# Patient Record
Sex: Female | Born: 1990
Health system: Southern US, Community
[De-identification: ages and names within clinical notes are randomized; demographics above are authoritative.]

## PROBLEM LIST (undated history)

## (undated) DIAGNOSIS — F32A Depression, unspecified: Secondary | ICD-10-CM

## (undated) DIAGNOSIS — F419 Anxiety disorder, unspecified: Secondary | ICD-10-CM

## (undated) DIAGNOSIS — T783XXA Angioneurotic edema, initial encounter: Secondary | ICD-10-CM

## (undated) DIAGNOSIS — F329 Major depressive disorder, single episode, unspecified: Secondary | ICD-10-CM

---
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
## 2008-12-30 ENCOUNTER — Ambulatory Visit (HOSPITAL_BASED_OUTPATIENT_CLINIC_OR_DEPARTMENT_OTHER): Admission: RE | Admit: 2008-12-30 | Discharge: 2008-12-30 | Payer: Self-pay | Admitting: Orthopedic Surgery

## 2009-02-09 ENCOUNTER — Ambulatory Visit: Payer: Self-pay | Admitting: Women's Health

## 2009-04-01 ENCOUNTER — Ambulatory Visit: Payer: Self-pay | Admitting: Women's Health

## 2009-04-18 ENCOUNTER — Ambulatory Visit: Payer: Self-pay | Admitting: Obstetrics and Gynecology

## 2009-04-20 ENCOUNTER — Ambulatory Visit: Payer: Self-pay | Admitting: Women's Health

## 2009-05-04 ENCOUNTER — Ambulatory Visit: Payer: Self-pay | Admitting: Obstetrics and Gynecology

## 2009-09-28 ENCOUNTER — Ambulatory Visit: Payer: Self-pay | Admitting: Women's Health

## 2009-11-29 ENCOUNTER — Ambulatory Visit: Payer: Self-pay | Admitting: Obstetrics and Gynecology

## 2009-11-29 ENCOUNTER — Other Ambulatory Visit: Admission: RE | Admit: 2009-11-29 | Discharge: 2009-11-29 | Payer: Self-pay | Admitting: Obstetrics and Gynecology

## 2010-04-05 ENCOUNTER — Ambulatory Visit: Payer: Self-pay | Admitting: Women's Health

## 2011-01-03 LAB — POCT HEMOGLOBIN-HEMACUE: Hemoglobin: 14.9 g/dL (ref 12.0–15.0)

## 2011-01-29 ENCOUNTER — Other Ambulatory Visit (HOSPITAL_COMMUNITY)
Admission: RE | Admit: 2011-01-29 | Discharge: 2011-01-29 | Disposition: A | Discharge status: Home / Self Care | Payer: BC Managed Care – PPO | Source: Ambulatory Visit | Attending: Obstetrics and Gynecology | Admitting: Obstetrics and Gynecology

## 2011-01-29 ENCOUNTER — Encounter (INDEPENDENT_AMBULATORY_CARE_PROVIDER_SITE_OTHER): Payer: BC Managed Care – PPO | Admitting: Women's Health

## 2011-01-29 ENCOUNTER — Other Ambulatory Visit: Payer: Self-pay | Admitting: Women's Health

## 2011-01-29 DIAGNOSIS — Z124 Encounter for screening for malignant neoplasm of cervix: Secondary | ICD-10-CM | POA: Insufficient documentation

## 2011-01-29 DIAGNOSIS — R823 Hemoglobinuria: Secondary | ICD-10-CM

## 2011-01-29 DIAGNOSIS — E079 Disorder of thyroid, unspecified: Secondary | ICD-10-CM

## 2011-01-29 DIAGNOSIS — Z01419 Encounter for gynecological examination (general) (routine) without abnormal findings: Secondary | ICD-10-CM

## 2011-02-06 NOTE — Op Note (Signed)
Ariel Richardson, Ariel Richardson                 ACCOUNT NO.:  [id number]   MEDICAL RECORD NO.:  [id number]          PATIENT TYPE:  AMB   LOCATION:  DSC                          FACILITY:  [facility]   PHYSICIAN:  Katy Fitch. Sypher, M.D. DATE OF BIRTH:  Apr 22, 1991   DATE OF PROCEDURE:  DATE OF DISCHARGE:                               OPERATIVE REPORT   PREOPERATIVE DIAGNOSIS:  Chronic left index radial proper digital nerve  laceration sustained at level of proximal finger flexion crease, left  index finger, October 30, 2008.   POSTOPERATIVE DIAGNOSIS:  Complete transection of radial proper digital  artery and nerve at level of index finger proximal finger flexion  crease.  No evidence of injury to flexor sheath.  No injury to ulnar  proper digital nerve and artery.   OPERATION:  Exploration of left index finger radial proper digital  artery and nerve with subsequent resection of neuroma and fully glioma  from stumps of radial proper digital nerve followed by delayed primary  repair with microsurgical technique and 9-0 nylon.   OPERATIONS:  Katy Fitch. Sypher, MD   ASSISTANT:  Ariel Reeks Dasnoit, PA   ANESTHESIA:  Left infraclavicular block.   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   Ariel Richardson was visiting [facility] and sustained a laceration of her  hand while preparing for [employer].  She was evaluated at [facility] and advised to undergo exploration of her hand and repair  of her nerve versus nerve tube reconstruction.   Due to a background history of idiopathic angioedema, she had her family  were concerned about proceeding with anesthesia in [place]  and  elected to return home to [place],  [place].   She is under the care of Dr. Homero Richardson, [facility], for  her angioedema predicament that was noted after a wisdom tooth  extraction procedure under general anesthesia.   A preoperative anesthesia consult was obtained with Dr. Sampson Richardson and  arrangements were made to perform her surgery under a regional block.   After detailed informed consent in which we advised Ariel Richardson that we would  approach for nerve injury with one of three solutions:  A.  Delayed primary repair.  B.  Use of an autogenous posterior interosseous nerve graft.  C.  Utilization of a collagen nerve conduit.   Given the location of the injury, there was an excellent chance that we  will be able to perform a delayed primary repair without tension due to  our ability to flex the metacarpal phalangeal joint and mobilize the  proper digital nerve proximal distal stumps.   After informed consent, she is brought to the operating room at this  time.   PROCEDURE:  Ariel Richardson was brought to the operating room and placed  in  supine position upon operating table.   Following an anesthesia consult with Dr. Sampson Richardson, a left  infraclavicular block was placed without complication.  There was no  evidence of any angioedema issues.  Ariel Richardson had been on danazol 5 days  preoperatively.   When anesthesia was satisfactory, she was transferred to room 8 of the  [facility], placed in supine position upon the operating table  and IV sedation provided under Dr. Jarrett Ables direct supervision.   The left arm was prepped with Betadine soap solution and sterilely  draped.  A pneumatic tourniquet was applied to the proximal left  brachium.  Upon exsanguination of the left arm with Esmarch bandage,  arterial tourniquet was inflated to 220 mmHg.  The procedure commenced  with creation of a radially-based flap extending from the distal palmar  crease to  the PIP flexion crease.  Subcutaneous tissues were carefully  divided taking care to identify the flexor sheath and scar at the site  of the laceration.  The proximal and distal segments of the radial  proper digital nerve were dissected into the area of scar, and a  complete transection of the proper digital artery and nerve identified.  The neuroma and glioma stumps were mobilized and were found to have  retraction of less than 2 mm.  With neurolysis, we were able to easily  overlap the neuroma and ganglioma stumps with a finger in full  extension.   With the aid of the operating microscope, the neuroma and glioma stumps  were transected with removal of approximately 2 mm of the glioma stump  and 3 mm of the neuroma stump.   With flexion of the MP joint of only 30 degrees, we were able to overlap  the nerve without tension.   The nerve was then repaired with the aid of a operating microscope  utilizing 9-0 nylon simple epineural sutures of back wall first  technique.  The epineurium was well approximated encapsulating all of  the neural elements.   Mehr's finger was maintained in a position of flexion at the MP joint  beyond 40 degrees throughout the repair.   The wound was then inspected for bleeding points and repaired with  interrupted suture of 5-0 nylon.   A compressive dressing was applied with Xeroflo sterile gauze, sterile  Webril, and a dorsal plaster splint maintaining the wrist in 30 degrees  of palmar flexion and the index finger in 40 degrees flexion at the MP  joint.  This should allow a tension-free repair.   Our goal will be to maintain a flexed posture of the index finger MP  joint for at least 3 weeks postoperatively.  Thereafter, we will  serially extension splint Vida's finger and allow recovery of nerve  glide.   There were no apparent complications.   Ariel Richardson tolerated surgery and anesthesia well.  She was transferred to  recovery with stable  signs.  She will be discharged home with  prescriptions for Vicodin 5 mg 1 p.o. 4-6 hours p.r.n. pain 24 tablets  without refill.  Also, doxycycline 100 mg p.o. q.12 hours x4 days as a  prophylactic antibiotic.      Katy Fitch Sypher, M.D.  Electronically Signed     RVS/MEDQ  D:  12/30/2008  T:  12/30/2008  Job:  [id number]

## 2011-04-06 ENCOUNTER — Institutional Professional Consult (permissible substitution) (INDEPENDENT_AMBULATORY_CARE_PROVIDER_SITE_OTHER): Payer: BC Managed Care – PPO | Admitting: Women's Health

## 2011-04-06 DIAGNOSIS — F329 Major depressive disorder, single episode, unspecified: Secondary | ICD-10-CM

## 2011-10-13 ENCOUNTER — Ambulatory Visit (INDEPENDENT_AMBULATORY_CARE_PROVIDER_SITE_OTHER): Payer: BC Managed Care – PPO

## 2011-10-13 DIAGNOSIS — R05 Cough: Secondary | ICD-10-CM

## 2011-10-13 DIAGNOSIS — R509 Fever, unspecified: Secondary | ICD-10-CM

## 2011-10-13 DIAGNOSIS — IMO0001 Reserved for inherently not codable concepts without codable children: Secondary | ICD-10-CM

## 2012-03-04 DIAGNOSIS — D841 Defects in the complement system: Secondary | ICD-10-CM | POA: Insufficient documentation

## 2012-03-31 DIAGNOSIS — Z975 Presence of (intrauterine) contraceptive device: Secondary | ICD-10-CM | POA: Insufficient documentation

## 2012-07-21 ENCOUNTER — Ambulatory Visit (INDEPENDENT_AMBULATORY_CARE_PROVIDER_SITE_OTHER): Payer: PRIVATE HEALTH INSURANCE | Admitting: Family Medicine

## 2012-07-21 ENCOUNTER — Encounter: Payer: Self-pay | Admitting: Emergency Medicine

## 2012-07-21 VITALS — BP 102/62 | HR 87 | Temp 98.1°F | Resp 16 | Ht 64.0 in | Wt 113.4 lb

## 2012-07-21 DIAGNOSIS — J029 Acute pharyngitis, unspecified: Secondary | ICD-10-CM

## 2012-07-21 DIAGNOSIS — R05 Cough: Secondary | ICD-10-CM

## 2012-07-21 DIAGNOSIS — J40 Bronchitis, not specified as acute or chronic: Secondary | ICD-10-CM

## 2012-07-21 DIAGNOSIS — D841 Defects in the complement system: Secondary | ICD-10-CM | POA: Insufficient documentation

## 2012-07-21 LAB — POCT RAPID STREP A (OFFICE): Rapid Strep A Screen: NEGATIVE

## 2012-07-21 MED ORDER — AZITHROMYCIN 250 MG PO TABS: ORAL_TABLET | ORAL | Status: DC

## 2012-07-21 NOTE — Progress Notes (Signed)
[facility] [address] [place] [address] [phone]  Date:  07/21/2012   Name:  Ariel Richardson   DOB:  1991-07-28   MRN:  [id number]  PCP:  No primary provider on file.    Chief Complaint: Cough, Sore Throat, Headache and Shortness of Breath   History of Present Illness:  Ariel Richardson is a 21 y.o. very pleasant female patient who presents with the following:  She is here today with a ST, fatigue. She has felt "run down" for a couple of weeks- not enough to have to miss work.  Then her ST started 2 days ago and she has been feeling worse.   She also has a cough- non productive.  She does feel congested.  She has been coughing for a few weeks- the also has gotten worse over the last few days She does not think that she has had a fever.  She has noted body aches and chills.  No GI symptom Her BF was sick last week.   She has implanon- she does not get menses.  Her implanon was replaced over the summer.   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  There is no problem list on file for this patient.   No past medical history on file.  No past surgical history on file.  History  Substance Use Topics  . Smoking status: Never Smoker   . Smokeless tobacco: Not on file  . Alcohol Use: Not on file    No family history on file.  No Known Allergies  Medication list has been reviewed and updated.  Current Outpatient Prescriptions on File Prior to Visit  Medication Sig Dispense Refill  . amphetamine-dextroamphetamine (ADDERALL XR) 10 MG 24 hr capsule Take 10 mg by mouth. Takes 1-2 per day during the week. Takes only prn on weekends.      . etonogestrel (IMPLANON) 68 MG IMPL implant Inject 1 each into the skin once.        Review of Systems:  As per HPI- otherwise negative.   Physical Examination: Filed Vitals:   07/21/12 0833  BP: 102/62  Pulse: 87  Temp: 98.1  F (36.7 C)  Resp: 16   Filed Vitals:   07/21/12 0833  Height: 5\' 4"  (1.626 m)  Weight: 113 lb 6.4 oz (51.438 kg)   Body mass index is 19.47 kg/(m^2). Ideal Body Weight: Weight in (lb) to have BMI = 25: 145.3   GEN: WDWN, NAD, Non-toxic, A & O x 3- coughing in room   HEENT: Atraumatic, Normocephalic. Neck supple. No masses, No LAD.  Bilateral TM wnl, oropharynx normal.  PEERL,EOMI.   Ears and Nose: No external deformity. CV: RRR, No M/G/R. No JVD. No thrill. No extra heart sounds. PULM: CTA B, no wheezes, crackles, rhonchi. No retractions. No resp. distress. No accessory muscle use. ABD: S, NT, ND, +BS. No rebound. No HSM. EXTR: No c/c/e NEURO Normal gait.  PSYCH: Normally interactive. Conversant. Not depressed or anxious appearing.  Calm demeanor.   Results for orders placed in visit on 07/21/12  POCT RAPID STREP A (OFFICE)      Component Value Range   Rapid Strep A Screen Negative  Negative   Assessment and Plan: 1. Sore throat  POCT rapid strep A  2. Cough  azithromycin (ZITHROMAX) 250 MG tablet  3. Bronchitis     Ariel Richardson has been ill for a  couple of weeks- her cough is now worse and she is feeling worse overall.  Will treat for bronchitis with azithromycin.  Instructed to let me know if not feeling better in the next few days- Sooner if worse.   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

## 2012-07-28 ENCOUNTER — Telehealth: Payer: Self-pay

## 2012-07-28 NOTE — Telephone Encounter (Signed)
The patient called to request that a clinical team member return her call.  The patient stated she was treated last Monday (07/21/12) with a Z pak and she is still not better.  Please call the patient at [phone].

## 2012-07-28 NOTE — Telephone Encounter (Signed)
States she has sore throat, she is advised the Z pack will continue to work for several days, she is to take Aleve or Tylenol for the sore throat, if not better in next 1-2 days she needs recheck.

## 2013-08-11 ENCOUNTER — Telehealth: Payer: Self-pay | Admitting: *Deleted

## 2013-08-11 NOTE — Telephone Encounter (Signed)
Pt called requested medical advice about some bleeding and has not been seen since 2012. I explained to pt that she would need a OV since last OV in 2012. Pt said she will find a MD locally in [place] area.

## 2015-01-20 ENCOUNTER — Other Ambulatory Visit (HOSPITAL_COMMUNITY): Payer: Self-pay | Admitting: Internal Medicine

## 2015-01-20 DIAGNOSIS — R110 Nausea: Secondary | ICD-10-CM

## 2015-01-20 DIAGNOSIS — R51 Headache: Principal | ICD-10-CM

## 2015-01-20 DIAGNOSIS — R519 Headache, unspecified: Secondary | ICD-10-CM

## 2015-01-20 DIAGNOSIS — R42 Dizziness and giddiness: Secondary | ICD-10-CM

## 2015-01-21 ENCOUNTER — Other Ambulatory Visit: Payer: Self-pay | Admitting: Internal Medicine

## 2015-01-21 DIAGNOSIS — R42 Dizziness and giddiness: Secondary | ICD-10-CM

## 2017-03-30 ENCOUNTER — Emergency Department (HOSPITAL_BASED_OUTPATIENT_CLINIC_OR_DEPARTMENT_OTHER): Payer: BC Managed Care – PPO

## 2017-03-30 ENCOUNTER — Emergency Department (HOSPITAL_BASED_OUTPATIENT_CLINIC_OR_DEPARTMENT_OTHER)
Admission: EM | Admit: 2017-03-30 | Discharge: 2017-03-30 | Disposition: A | Discharge status: Home / Self Care | Payer: BC Managed Care – PPO | Attending: Emergency Medicine | Admitting: Emergency Medicine

## 2017-03-30 ENCOUNTER — Encounter (HOSPITAL_BASED_OUTPATIENT_CLINIC_OR_DEPARTMENT_OTHER): Payer: Self-pay | Admitting: Emergency Medicine

## 2017-03-30 DIAGNOSIS — N200 Calculus of kidney: Secondary | ICD-10-CM | POA: Insufficient documentation

## 2017-03-30 DIAGNOSIS — R1031 Right lower quadrant pain: Secondary | ICD-10-CM | POA: Diagnosis present

## 2017-03-30 DIAGNOSIS — Z79899 Other long term (current) drug therapy: Secondary | ICD-10-CM | POA: Insufficient documentation

## 2017-03-30 HISTORY — DX: Angioneurotic edema, initial encounter: T78.3XXA

## 2017-03-30 HISTORY — DX: Anxiety disorder, unspecified: F41.9

## 2017-03-30 HISTORY — DX: Depression, unspecified: F32.A

## 2017-03-30 HISTORY — DX: Major depressive disorder, single episode, unspecified: F32.9

## 2017-03-30 LAB — COMPREHENSIVE METABOLIC PANEL
ALBUMIN: 4.2 g/dL (ref 3.5–5.0)
ALK PHOS: 113 U/L (ref 38–126)
ALT: 15 U/L (ref 14–54)
AST: 25 U/L (ref 15–41)
Anion gap: 12 (ref 5–15)
BILIRUBIN TOTAL: 0.4 mg/dL (ref 0.3–1.2)
BUN: 22 mg/dL — ABNORMAL HIGH (ref 6–20)
CALCIUM: 8.9 mg/dL (ref 8.9–10.3)
CO2: 24 mmol/L (ref 22–32)
Chloride: 103 mmol/L (ref 101–111)
Creatinine, Ser: 0.79 mg/dL (ref 0.44–1.00)
GFR calc Af Amer: >60 mL/min (ref >60)
GFR calc non Af Amer: >60 mL/min (ref >60)
GLUCOSE: 119 mg/dL — AB (ref 65–99)
Potassium: 3.1 mmol/L — ABNORMAL LOW (ref 3.5–5.1)
Sodium: 139 mmol/L (ref 135–145)
TOTAL PROTEIN: 7.4 g/dL (ref 6.5–8.1)

## 2017-03-30 LAB — URINALYSIS, ROUTINE W REFLEX MICROSCOPIC
BILIRUBIN URINE: NEGATIVE
Glucose, UA: NEGATIVE mg/dL
Hgb urine dipstick: NEGATIVE
Ketones, ur: NEGATIVE mg/dL
LEUKOCYTES UA: NEGATIVE
Nitrite: NEGATIVE
PROTEIN: NEGATIVE mg/dL
Specific Gravity, Urine: 1.023 (ref 1.005–1.030)
pH: 6 (ref 5.0–8.0)

## 2017-03-30 LAB — CBC WITH DIFFERENTIAL/PLATELET
BASOS ABS: 0 10*3/uL (ref 0.0–0.1)
BASOS PCT: 0 %
Eosinophils Absolute: 0.1 10*3/uL (ref 0.0–0.7)
Eosinophils Relative: 2 %
HEMATOCRIT: 37.2 % (ref 36.0–46.0)
HEMOGLOBIN: 12.8 g/dL (ref 12.0–15.0)
Lymphocytes Relative: 40 %
Lymphs Abs: 2.9 10*3/uL (ref 0.7–4.0)
MCH: 29.4 pg (ref 26.0–34.0)
MCHC: 34.4 g/dL (ref 30.0–36.0)
MCV: 85.5 fL (ref 78.0–100.0)
Monocytes Absolute: 0.5 10*3/uL (ref 0.1–1.0)
Monocytes Relative: 7 %
NEUTROS ABS: 3.7 10*3/uL (ref 1.7–7.7)
NEUTROS PCT: 51 %
Platelets: 211 10*3/uL (ref 150–400)
RBC: 4.35 MIL/uL (ref 3.87–5.11)
RDW: 13.2 % (ref 11.5–15.5)
WBC: 7.3 10*3/uL (ref 4.0–10.5)

## 2017-03-30 LAB — PREGNANCY, URINE: PREG TEST UR: NEGATIVE

## 2017-03-30 LAB — LIPASE, BLOOD: Lipase: 31 U/L (ref 11–51)

## 2017-03-30 MED ORDER — ONDANSETRON HCL 4 MG/2ML IJ SOLN
4.0000 mg | Freq: Once | INTRAMUSCULAR | Status: AC
  Administered 2017-03-30: 4 mg via INTRAVENOUS
  Filled 2017-03-30: qty 2

## 2017-03-30 MED ORDER — IOPAMIDOL (ISOVUE-300) INJECTION 61%
100.0000 mL | Freq: Once | INTRAVENOUS | Status: AC | PRN
  Administered 2017-03-30: 100 mL via INTRAVENOUS

## 2017-03-30 MED ORDER — SODIUM CHLORIDE 0.9 % IV SOLN
Freq: Once | INTRAVENOUS | Status: AC
  Administered 2017-03-30: 13:00:00 via INTRAVENOUS

## 2017-03-30 MED ORDER — MORPHINE SULFATE (PF) 4 MG/ML IV SOLN
4.0000 mg | Freq: Once | INTRAVENOUS | Status: AC
  Administered 2017-03-30: 4 mg via INTRAVENOUS
  Filled 2017-03-30: qty 1

## 2017-03-30 MED ORDER — POTASSIUM CHLORIDE CRYS ER 20 MEQ PO TBCR
40.0000 meq | EXTENDED_RELEASE_TABLET | Freq: Once | ORAL | Status: AC
  Administered 2017-03-30: 40 meq via ORAL
  Filled 2017-03-30: qty 2

## 2017-03-30 MED ORDER — HYDROCODONE-ACETAMINOPHEN 5-325 MG PO TABS: 1.0000 | ORAL_TABLET | Freq: Four times a day (QID) | ORAL | 0 refills | Status: DC | PRN

## 2017-03-30 NOTE — ED Triage Notes (Signed)
RLQ pain radiating to back x 2 hours, denies NV.

## 2017-03-30 NOTE — ED Notes (Signed)
Pt in CT.

## 2017-03-30 NOTE — ED Notes (Signed)
Pt ambulatory with husband at side

## 2017-03-30 NOTE — Discharge Instructions (Signed)
It was my pleasure taking care of you today!   You have been diagnosed with kidney stones. Drink plenty of fluids to help you pass the stone.  Take Tylenol as directed for mild to moderate pain. Use your pain medication as directed and only as needed for severe pain. You will not be able to breast feed if you are on this medication.  Follow up with the urology clinic listed in regards to your hospital visit.   Return to the ED immediately if you develop fever that persists > 101, uncontrolled pain or vomiting, or other concerns.   Do not drink alcohol, drive or participate in any other potentially dangerous activities while taking opiate pain medication as it may make you sleepy. Do not take this medication with any other sedating medications, either prescription or over-the-counter. If you were prescribed Percocet or Vicodin, do not take these with acetaminophen (Tylenol) as it is already contained within these medications.   This medication is an opiate (or narcotic) pain medication and can be habit forming.  Use it as little as possible to achieve adequate pain control.  Do not use or use it with extreme caution if you have a history of opiate abuse or dependence. This medication is intended for your use only - do not give any to anyone else and keep it in a secure place where nobody else, especially children, have access to it. It will also cause or worsen constipation, so you may want to consider taking an over-the-counter stool softener while you are taking this medication.

## 2017-03-30 NOTE — ED Notes (Signed)
Pt teaching provided on medications that may cause drowsiness. Pt instructed not to drive or operate heavy machinery while taking the prescribed medication. Pt verbalized understanding.   

## 2017-03-30 NOTE — ED Provider Notes (Signed)
[facility] Note   CSN: [id number] Arrival date & time: 03/30/17  1145     History   Chief Complaint Chief Complaint  Patient presents with  . Abdominal Pain    HPI Ariel Richardson is a 26 y.o. female.  The history is provided by the patient and medical records. No language interpreter was used.   Ariel Richardson is a 26 y.o. female  with a PMH of hereditary angioedema, anxiety and depression who presents to the Emergency Department complaining of acute onset of cramping waxing-waning right lower quadrant pain which began this morning and has been progressively worsening. Radiates to lower back. She took a dose of her Berinert today which she takes for angioedema flare-ups, thinking this could be 2/2 angioedema flare. This did not help. She also took ibuprofen and home rx ativan. No aggravating factors noted. No dysuria but does have urinary frequency. No vaginal discharge or urinary urgency. No fevers. No hx of kidney stones. No history of similar pain.   Past Medical History:  Diagnosis Date  . Angio-edema   . Anxiety   . Depression     There are no active problems to display for this patient.   History reviewed. No pertinent surgical history.  OB History    No data available       Home Medications    Prior to Admission medications   Medication Sig Start Date End Date Taking? Authorizing Provider  amphetamine-dextroamphetamine (ADDERALL) 10 MG tablet Take 10 mg by mouth daily with breakfast.   Yes [provider]  escitalopram (LEXAPRO) 20 MG tablet Take 20 mg by mouth daily.   Yes [provider]  HYDROcodone-acetaminophen (NORCO/VICODIN) 5-325 MG tablet Take 1 tablet by mouth every 6 (six) hours as needed for severe pain. 03/30/17   Deanna Boehlke, Chase Picket, PA-C    Family History No family history on file.  Social History Social History  Substance Use Topics  . Smoking status: Never Smoker  . Smokeless tobacco: Never Used  .  Alcohol use Yes     Allergies   Patient has no known allergies.   Review of Systems Review of Systems  Constitutional: Negative for chills and fever.  Genitourinary: Positive for flank pain and frequency. Negative for difficulty urinating, dysuria, urgency, vaginal discharge and vaginal pain.  Musculoskeletal: Positive for back pain.  All other systems reviewed and are negative.    Physical Exam Updated Vital Signs BP (!) 107/58 (BP Location: Left Arm)   Pulse 72   Temp 97.6 F (36.4 C) (Oral)   Resp 18   Wt 59 kg (130 lb)   LMP 03/14/2017   SpO2 100%   Physical Exam  Constitutional: She is oriented to person, place, and time. She appears well-developed and well-nourished. No distress.  HENT:  Head: Normocephalic and atraumatic.  Cardiovascular: Normal rate, regular rhythm and normal heart sounds.   No murmur heard. Pulmonary/Chest: Effort normal and breath sounds normal. No respiratory distress. She has no wheezes. She has no rales.  Abdominal: Soft. Bowel sounds are normal. She exhibits no distension. There is tenderness.    Tenderness to palpation as depicted in image with no rebound or guarding. No focal tenderness at McBurney's point. No CVA tenderness.  Musculoskeletal: Normal range of motion.  Neurological: She is alert and oriented to person, place, and time.  Skin: Skin is warm and dry.  Nursing note and vitals reviewed.    ED Treatments / Results  Labs (all labs ordered are  listed, but only abnormal results are displayed) Labs Reviewed  COMPREHENSIVE METABOLIC PANEL - Abnormal; Notable for the following:       Result Value   Potassium 3.1 (*)    Glucose, Bld 119 (*)    BUN 22 (*)    All other components within normal limits  URINALYSIS, ROUTINE W REFLEX MICROSCOPIC  PREGNANCY, URINE  CBC WITH DIFFERENTIAL/PLATELET  LIPASE, BLOOD    EKG  EKG Interpretation None       Radiology Ct Abdomen Pelvis W Contrast  Result Date:  03/30/2017 CLINICAL DATA:  26 year old female with a history of right lower quadrant pain EXAM: CT ABDOMEN AND PELVIS WITH CONTRAST TECHNIQUE: Multidetector CT imaging of the abdomen and pelvis was performed using the standard protocol following bolus administration of intravenous contrast. CONTRAST:  ISOVUE-300 IOPAMIDOL (ISOVUE-300) INJECTION 61% COMPARISON:  None. FINDINGS: Lower chest: No acute abnormality. Hepatobiliary: No focal liver abnormality is seen. No gallstones, gallbladder wall thickening, or biliary dilatation. Pancreas: Unremarkable. No pancreatic ductal dilatation or surrounding inflammatory changes. Spleen: Normal in size without focal abnormality. Adrenals/Urinary Tract: Unremarkable adrenal glands. Right kidney without emphysema with perfusion similar to the left. Mild pelvicaliectasis and dilation of the right ureter. Right ureter is dilated throughout its length with enhancement of the urothelium. There is a an oblong stone at the ureterovesical junction measuring 6 mm - 7 mm in length and 2 mm -3 mm width. Unremarkable appearance of left kidney without hydronephrosis or nephrolithiasis. Unremarkable appearance of the urinary bladder. Stomach/Bowel: Unremarkable appearance of stomach, small bowel. Normal appendix. Mild a moderate formed stool burden. No transition point. Vascular/Lymphatic: Unremarkable appearance of the vasculature. No adenopathy. Reproductive: Unremarkable at adnexa. Other: No abdominal wall hernia or abnormality. No abdominopelvic ascites. Musculoskeletal: No acute bony abnormality. IMPRESSION: Obstructing right ureterovesical stone measuring 6 mm - 7 mm in greatest diameter. There is resulting mild hydronephrosis and dilation of the right ureter. Enhancement of the urothelial surface may reflect inflammation or infection. If there is concern for ascending urinary tract infection, recommend correlation with urinalysis. Electronically Signed   By: Gilmer Mor D.O.    On: 03/30/2017 14:17    Procedures Procedures (including critical care time)  Medications Ordered in ED Medications  morphine 4 MG/ML injection 4 mg (4 mg Intravenous Given 03/30/17 1245)  ondansetron (ZOFRAN) injection 4 mg (4 mg Intravenous Given 03/30/17 1245)  0.9 %  sodium chloride infusion ( Intravenous Stopped 03/30/17 1439)  iopamidol (ISOVUE-300) 61 % injection 100 mL (100 mLs Intravenous Contrast Given 03/30/17 1349)  potassium chloride SA (K-DUR,KLOR-CON) CR tablet 40 mEq (40 mEq Oral Given 03/30/17 1442)     Initial Impression / Assessment and Plan / ED Course  I have reviewed the triage vital signs and the nursing notes.  Pertinent labs & imaging results that were available during my care of the patient were reviewed by me and considered in my medical decision making (see chart for details).    Ariel Richardson is a 26 y.o. female who presents to ED for right lower abdominal pain. Patient is nontoxic, nonseptic appearing with tenderness to right lower quadrant and flank. No focal area of tenderness at McBurney's. No rebound tenderness. Patient does have a history of hereditary angioedema and has had GI flares in the past. She states this is not similar. She has no history of kidney stones. Given right lower quadrant pain and history of angioedema, will obtain CT abdomen pelvis with contrast instead of CT renal study. Patient given fluids, Zofran  and morphine. On reevaluation, pain is controlled. She no longer feels nauseous and has had no episodes of emesis. Labs reviewed and reassuring. She did have hypokalemia at 3.1 which was replenished in ED. UA shows no signs of infection or blood. CT renal study shows a 6-7 mm right uterovesical or stone with mild hydro.  Patient's pain and other symptoms have been adequately managed in the Emergency Department today. I feel she is appropriate for outpatient therapy with referral to nephrology if symptoms do not persist. She is breastfeeding. Discussed  tylenol PRN mild-to-moderate pain. If this is not enough pain control, she will have norco to use however she will need to pump and dump. We spoke at length about this with mother and significant other in the room. She expressed understanding of not breast feeding on this medication. Return precautions discussed and all questions answered.   Final Clinical Impressions(s) / ED Diagnoses   Final diagnoses:  Nephrolithiasis    New Prescriptions Discharge Medication List as of 03/30/2017  2:59 PM    START taking these medications   Details  HYDROcodone-acetaminophen (NORCO/VICODIN) 5-325 MG tablet Take 1 tablet by mouth every 6 (six) hours as needed for severe pain., Starting Sat 03/30/2017, Print         Kaylor Maiers, Chase Picket, PA-C 03/30/17 1532    Melene Plan, DO 03/30/17 1550

## 2017-03-31 ENCOUNTER — Encounter: Payer: Self-pay | Admitting: Emergency Medicine

## 2019-06-18 DIAGNOSIS — F32A Depression, unspecified: Secondary | ICD-10-CM | POA: Insufficient documentation

## 2020-07-19 DIAGNOSIS — D841 Defects in the complement system: Secondary | ICD-10-CM | POA: Diagnosis not present

## 2020-12-30 DIAGNOSIS — F4321 Adjustment disorder with depressed mood: Secondary | ICD-10-CM | POA: Diagnosis not present

## 2021-01-11 DIAGNOSIS — F419 Anxiety disorder, unspecified: Secondary | ICD-10-CM | POA: Diagnosis not present

## 2021-01-12 DIAGNOSIS — F4321 Adjustment disorder with depressed mood: Secondary | ICD-10-CM | POA: Diagnosis not present

## 2021-01-18 DIAGNOSIS — F411 Generalized anxiety disorder: Secondary | ICD-10-CM | POA: Diagnosis not present

## 2021-01-18 DIAGNOSIS — F419 Anxiety disorder, unspecified: Secondary | ICD-10-CM | POA: Diagnosis not present

## 2021-01-18 DIAGNOSIS — F900 Attention-deficit hyperactivity disorder, predominantly inattentive type: Secondary | ICD-10-CM | POA: Diagnosis not present

## 2021-01-20 DIAGNOSIS — F4321 Adjustment disorder with depressed mood: Secondary | ICD-10-CM | POA: Diagnosis not present

## 2021-01-25 DIAGNOSIS — F419 Anxiety disorder, unspecified: Secondary | ICD-10-CM | POA: Diagnosis not present

## 2021-01-27 DIAGNOSIS — F4321 Adjustment disorder with depressed mood: Secondary | ICD-10-CM | POA: Diagnosis not present

## 2021-02-02 DIAGNOSIS — F32A Depression, unspecified: Secondary | ICD-10-CM | POA: Diagnosis not present

## 2021-02-02 DIAGNOSIS — F411 Generalized anxiety disorder: Secondary | ICD-10-CM | POA: Diagnosis not present

## 2021-02-10 DIAGNOSIS — F4321 Adjustment disorder with depressed mood: Secondary | ICD-10-CM | POA: Diagnosis not present

## 2021-02-15 DIAGNOSIS — F419 Anxiety disorder, unspecified: Secondary | ICD-10-CM | POA: Diagnosis not present

## 2021-03-07 DIAGNOSIS — F419 Anxiety disorder, unspecified: Secondary | ICD-10-CM | POA: Diagnosis not present

## 2021-03-10 DIAGNOSIS — U071 COVID-19: Secondary | ICD-10-CM | POA: Diagnosis not present

## 2021-03-30 DIAGNOSIS — F411 Generalized anxiety disorder: Secondary | ICD-10-CM | POA: Diagnosis not present

## 2021-03-30 DIAGNOSIS — F900 Attention-deficit hyperactivity disorder, predominantly inattentive type: Secondary | ICD-10-CM | POA: Diagnosis not present

## 2021-03-31 DIAGNOSIS — F419 Anxiety disorder, unspecified: Secondary | ICD-10-CM | POA: Diagnosis not present

## 2021-04-05 DIAGNOSIS — F419 Anxiety disorder, unspecified: Secondary | ICD-10-CM | POA: Diagnosis not present

## 2021-04-14 DIAGNOSIS — F419 Anxiety disorder, unspecified: Secondary | ICD-10-CM | POA: Diagnosis not present

## 2021-04-21 DIAGNOSIS — F419 Anxiety disorder, unspecified: Secondary | ICD-10-CM | POA: Diagnosis not present

## 2021-05-15 ENCOUNTER — Other Ambulatory Visit (HOSPITAL_COMMUNITY)
Admission: RE | Admit: 2021-05-15 | Discharge: 2021-05-15 | Disposition: A | Discharge status: Home / Self Care | Payer: BC Managed Care – PPO | Source: Ambulatory Visit | Attending: Obstetrics & Gynecology | Admitting: Obstetrics & Gynecology

## 2021-05-15 ENCOUNTER — Ambulatory Visit (HOSPITAL_BASED_OUTPATIENT_CLINIC_OR_DEPARTMENT_OTHER): Payer: BC Managed Care – PPO | Admitting: Obstetrics & Gynecology

## 2021-05-15 ENCOUNTER — Encounter (HOSPITAL_BASED_OUTPATIENT_CLINIC_OR_DEPARTMENT_OTHER): Payer: Self-pay | Admitting: Obstetrics & Gynecology

## 2021-05-15 ENCOUNTER — Other Ambulatory Visit: Payer: Self-pay

## 2021-05-15 VITALS — BP 119/76 | HR 83 | Ht 63.0 in | Wt 145.0 lb

## 2021-05-15 DIAGNOSIS — Z975 Presence of (intrauterine) contraceptive device: Secondary | ICD-10-CM

## 2021-05-15 DIAGNOSIS — Z124 Encounter for screening for malignant neoplasm of cervix: Secondary | ICD-10-CM

## 2021-05-15 DIAGNOSIS — D841 Defects in the complement system: Secondary | ICD-10-CM

## 2021-05-15 DIAGNOSIS — R5383 Other fatigue: Secondary | ICD-10-CM | POA: Diagnosis not present

## 2021-05-15 DIAGNOSIS — Z01419 Encounter for gynecological examination (general) (routine) without abnormal findings: Secondary | ICD-10-CM | POA: Diagnosis not present

## 2021-05-15 NOTE — Progress Notes (Signed)
30 y.o. [id number] Married White or Caucasian female here for annual exam/new patient exam.  She has moved around a lot the past several years.  Her husband is a Nurse, mental health and has work with [employer].  Now with and on line company.    Cycles are "fairly" regular.  Has a Nexplanon and this was placed post partum.  They are undecided about adding to their family.  She reports significant perinatal anxiety.  She was treated with Lexapro and bupropion.  She has stopped the bupropion.  Anxiety is much better now.  She see Dr. Keturah BarreGerrit Friends, psychiatrist, in [place].     Patient's last menstrual period was 05/08/2021 (exact date).          Sexually active: Yes.    The current method of family planning is Nexplanon.    Exercising: not formal Smoker:  no  Health Maintenance: Pap:  pt thinks 2020, cannot find in care everywhere History of abnormal Pap:  no MMG:  screening guidelines discussed Colonoscopy:  screening guidelines reviewed TDaP:  unsure Screening Labs: will draw today   reports that she has never smoked. She has never used smokeless tobacco. She reports current alcohol use. She reports that she does not use drugs.  Past Medical History:  Diagnosis Date   Angio-edema    Anxiety    Depression     History reviewed. No pertinent surgical history.  Current Outpatient Medications  Medication Sig Dispense Refill   amphetamine-dextroamphetamine (ADDERALL XR) 10 MG 24 hr capsule Take 10 mg by mouth. Takes 1-2 per day during the week. Takes only prn on weekends.     C1 esterase inhibitor, Human, (BERINERT) 500 UNITS KIT Inject 20 Units/kg into the vein.     escitalopram (LEXAPRO) 20 MG tablet Take 30 mg by mouth daily.     etonogestrel (NEXPLANON) 68 MG IMPL implant Inject 1 each into the skin once.     LORazepam (ATIVAN) 0.5 MG tablet Take 0.5 mg by mouth 2 (two) times daily as needed.     Multiple Vitamin (MULTIVITAMIN) tablet Take 1 tablet by mouth daily.     TAKHZYRO  300 MG/2ML SOLN Inject into the skin.     No current facility-administered medications for this visit.    Family History  Problem Relation Age of Onset   Dementia Paternal Grandfather    Breast cancer Paternal Grandmother    Angioedema Mother    Depression Mother    Anxiety disorder Mother    Angioedema Brother    Angioedema Sister    Angioedema Son     Review of Systems  Constitutional:  Positive for fatigue.   Exam:   BP 119/76   Pulse 83   Ht _0  (1.6 m)   Wt 145 lb (65.8 kg)   LMP 05/08/2021 (Exact Date)   BMI 25.69 kg/m   Height: _1  (160 cm)  General appearance: alert, cooperative and appears stated age Head: Normocephalic, without obvious abnormality, atraumatic Neck: no adenopathy, supple, symmetrical, trachea midline and thyroid normal to inspection and palpation Lungs: clear to auscultation bilaterally Breasts: normal appearance, no masses or tenderness Heart: regular rate and rhythm Abdomen: soft, non-tender; bowel sounds normal; no masses,  no organomegaly Extremities: extremities normal, atraumatic, no cyanosis or edema Skin: Skin color, texture, turgor normal. No rashes or lesions Lymph nodes: Cervical, supraclavicular, and axillary nodes normal. No abnormal inguinal nodes palpated Neurologic: Grossly normal   Pelvic: External genitalia:  no lesions  Urethra:  normal appearing urethra with no masses, tenderness or lesions              Bartholins and Skenes: normal                 Vagina: normal appearing vagina with normal color and no discharge, no lesions              Cervix: no lesions              Pap taken: Yes.   Bimanual Exam:  Uterus:  normal size, contour, position, consistency, mobility, non-tender              Adnexa: normal adnexa and no mass, fullness, tenderness               Rectovaginal: Confirms               Anus:  normal sphincter tone, no lesions  Chaperone, Octaviano Batty, CMA, was present for  exam.  Assessment/Plan: 1. Well woman exam with routine gynecological exam - pap smear with HR HPV obtained today - breast and colon cancer screening guidelines reivewed  2. Fatigue, unspecified type - CBC - TSH  3. Implanon in place  4. Hereditary angioedema ([place])

## 2021-05-16 DIAGNOSIS — F411 Generalized anxiety disorder: Secondary | ICD-10-CM | POA: Diagnosis not present

## 2021-05-16 DIAGNOSIS — L814 Other melanin hyperpigmentation: Secondary | ICD-10-CM | POA: Diagnosis not present

## 2021-05-16 DIAGNOSIS — C44529 Squamous cell carcinoma of skin of other part of trunk: Secondary | ICD-10-CM | POA: Diagnosis not present

## 2021-05-16 DIAGNOSIS — L821 Other seborrheic keratosis: Secondary | ICD-10-CM | POA: Diagnosis not present

## 2021-05-16 DIAGNOSIS — D2262 Melanocytic nevi of left upper limb, including shoulder: Secondary | ICD-10-CM | POA: Diagnosis not present

## 2021-05-16 DIAGNOSIS — C44621 Squamous cell carcinoma of skin of unspecified upper limb, including shoulder: Secondary | ICD-10-CM | POA: Diagnosis not present

## 2021-05-16 DIAGNOSIS — D2261 Melanocytic nevi of right upper limb, including shoulder: Secondary | ICD-10-CM | POA: Diagnosis not present

## 2021-05-16 DIAGNOSIS — F900 Attention-deficit hyperactivity disorder, predominantly inattentive type: Secondary | ICD-10-CM | POA: Diagnosis not present

## 2021-05-16 LAB — CBC
Hematocrit: 39.5 % (ref 34.0–46.6)
Hemoglobin: 12.6 g/dL (ref 11.1–15.9)
MCH: 27.9 pg (ref 26.6–33.0)
MCHC: 31.9 g/dL (ref 31.5–35.7)
MCV: 88 fL (ref 79–97)
Platelets: 267 10*3/uL (ref 150–450)
RBC: 4.51 x10E6/uL (ref 3.77–5.28)
RDW: 13.3 % (ref 11.7–15.4)
WBC: 8.1 10*3/uL (ref 3.4–10.8)

## 2021-05-16 LAB — TSH: TSH: 1.83 u[IU]/mL (ref 0.450–4.500)

## 2021-05-18 ENCOUNTER — Encounter (HOSPITAL_BASED_OUTPATIENT_CLINIC_OR_DEPARTMENT_OTHER): Payer: Self-pay

## 2021-05-18 LAB — CYTOLOGY - PAP
Comment: NEGATIVE
Diagnosis: UNDETERMINED — AB
High risk HPV: NEGATIVE

## 2021-05-19 DIAGNOSIS — F419 Anxiety disorder, unspecified: Secondary | ICD-10-CM | POA: Diagnosis not present

## 2021-05-26 DIAGNOSIS — F419 Anxiety disorder, unspecified: Secondary | ICD-10-CM | POA: Diagnosis not present

## 2021-05-31 NOTE — Telephone Encounter (Signed)
Late entry Pt was called see result note from 05/19/21 on pap smear KW CMA

## 2021-06-02 DIAGNOSIS — F419 Anxiety disorder, unspecified: Secondary | ICD-10-CM | POA: Diagnosis not present

## 2021-06-19 DIAGNOSIS — C44629 Squamous cell carcinoma of skin of left upper limb, including shoulder: Secondary | ICD-10-CM | POA: Diagnosis not present

## 2021-06-19 DIAGNOSIS — L988 Other specified disorders of the skin and subcutaneous tissue: Secondary | ICD-10-CM | POA: Diagnosis not present

## 2021-06-30 DIAGNOSIS — F419 Anxiety disorder, unspecified: Secondary | ICD-10-CM | POA: Diagnosis not present

## 2021-07-05 DIAGNOSIS — J209 Acute bronchitis, unspecified: Secondary | ICD-10-CM | POA: Diagnosis not present

## 2021-07-07 DIAGNOSIS — F419 Anxiety disorder, unspecified: Secondary | ICD-10-CM | POA: Diagnosis not present

## 2021-07-18 DIAGNOSIS — F419 Anxiety disorder, unspecified: Secondary | ICD-10-CM | POA: Diagnosis not present

## 2021-07-20 DIAGNOSIS — D841 Defects in the complement system: Secondary | ICD-10-CM | POA: Diagnosis not present

## 2021-08-22 DIAGNOSIS — F411 Generalized anxiety disorder: Secondary | ICD-10-CM | POA: Diagnosis not present

## 2021-08-22 DIAGNOSIS — F900 Attention-deficit hyperactivity disorder, predominantly inattentive type: Secondary | ICD-10-CM | POA: Diagnosis not present

## 2021-10-04 ENCOUNTER — Emergency Department (HOSPITAL_BASED_OUTPATIENT_CLINIC_OR_DEPARTMENT_OTHER)
Admission: EM | Admit: 2021-10-04 | Discharge: 2021-10-04 | Disposition: A | Discharge status: Home / Self Care | Payer: BC Managed Care – PPO | Attending: Emergency Medicine | Admitting: Emergency Medicine

## 2021-10-04 ENCOUNTER — Encounter (HOSPITAL_BASED_OUTPATIENT_CLINIC_OR_DEPARTMENT_OTHER): Payer: Self-pay

## 2021-10-04 ENCOUNTER — Emergency Department (HOSPITAL_BASED_OUTPATIENT_CLINIC_OR_DEPARTMENT_OTHER): Payer: BC Managed Care – PPO

## 2021-10-04 ENCOUNTER — Other Ambulatory Visit: Payer: Self-pay

## 2021-10-04 DIAGNOSIS — F0781 Postconcussional syndrome: Secondary | ICD-10-CM | POA: Diagnosis not present

## 2021-10-04 DIAGNOSIS — S0990XA Unspecified injury of head, initial encounter: Secondary | ICD-10-CM | POA: Diagnosis not present

## 2021-10-04 DIAGNOSIS — R519 Headache, unspecified: Secondary | ICD-10-CM | POA: Diagnosis not present

## 2021-10-04 DIAGNOSIS — R110 Nausea: Secondary | ICD-10-CM | POA: Diagnosis not present

## 2021-10-04 DIAGNOSIS — R5383 Other fatigue: Secondary | ICD-10-CM | POA: Diagnosis not present

## 2021-10-04 MED ORDER — IBUPROFEN 800 MG PO TABS
800.0000 mg | ORAL_TABLET | Freq: Once | ORAL | Status: AC
  Administered 2021-10-04: 800 mg via ORAL
  Filled 2021-10-04: qty 1

## 2021-10-04 NOTE — ED Provider Notes (Signed)
[place] EMERGENCY DEPARTMENT Provider Note    CSN: [id number] Arrival date & time: 10/04/21 1102  History Chief Complaint  Patient presents with   Head Injury    Ariel Richardson is a 31 y.o. female reports she was helmeted while riding a horse yesterday when she was thrown off. She is unsure how she landed but she thinks she hit her head. She did not have LOC. But has been having a posterior headache and feeling foggy headed since then. She has had some muscles soreness in her neck, lower back and L hip. No trouble walking.    Home Medications Prior to Admission medications   Medication Sig Start Date End Date Taking? Authorizing Provider  amphetamine-dextroamphetamine (ADDERALL XR) 10 MG 24 hr capsule Take 10 mg by mouth. Takes 1-2 per day during the week. Takes only prn on weekends.    [provider]  C1 esterase inhibitor, Human, (BERINERT) 500 UNITS KIT Inject 20 Units/kg into the vein.    [provider]  escitalopram (LEXAPRO) 20 MG tablet Take 30 mg by mouth daily.    [provider]  etonogestrel (NEXPLANON) 68 MG IMPL implant Inject 1 each into the skin once.    [provider]  LORazepam (ATIVAN) 0.5 MG tablet Take 0.5 mg by mouth 2 (two) times daily as needed. 01/18/21   [provider]  Multiple Vitamin (MULTIVITAMIN) tablet Take 1 tablet by mouth daily.    [provider]  TAKHZYRO 300 MG/2ML SOLN Inject into the skin. 03/28/21   [provider]     Allergies    Patient has no known allergies.   Review of Systems   Review of Systems Please see HPI for pertinent positives and negatives  Physical Exam BP 119/74 (BP Location: Right Arm)    Pulse 75    Temp 97.8 F (36.6 C) (Oral)    Resp 18    Ht $R'5\' 2"'of$  (1.575 m)    Wt 69.1 kg    LMP 09/25/2021    SpO2 100%    BMI 27.87 kg/m   Physical Exam Vitals and nursing note reviewed.  Constitutional:      Appearance: Normal appearance.  HENT:      Head: Normocephalic and atraumatic.     Nose: Nose normal.     Mouth/Throat:     Mouth: Mucous membranes are moist.  Eyes:     Extraocular Movements: Extraocular movements intact.     Conjunctiva/sclera: Conjunctivae normal.  Neck:     Comments: No midline spine tenderness Cardiovascular:     Rate and Rhythm: Normal rate.  Pulmonary:     Effort: Pulmonary effort is normal.     Breath sounds: Normal breath sounds.  Abdominal:     General: Abdomen is flat.     Palpations: Abdomen is soft.     Tenderness: There is no abdominal tenderness.  Musculoskeletal:        General: No swelling. Normal range of motion.     Cervical back: Neck supple.     Comments: No midline spine or bony tenderness in hips  Skin:    General: Skin is warm and dry.  Neurological:     General: No focal deficit present.     Mental Status: She is alert.  Psychiatric:        Mood and Affect: Mood normal.    ED Results / Procedures / Treatments   EKG None  Procedures Procedures  Medications Ordered in the ED  Medications  ibuprofen (ADVIL) tablet 800 mg (800 mg Oral Given 10/04/21 1138)    Initial Impression and Plan  Patient with possible head injury, now with headache and feeling foggy. Will check CT, otherwise no signs of significant injury. Motrin for pain.   ED Course   Clinical Course as of 10/04/21 1208  Wed Oct 04, 2021  1205 CT images and results reviewed, neg for intracranial injury. Not having any sinus related symptoms. She was given head injury precautions and information regarding post-concussive care. Advised to follow up with PCP if not improving in the next few days.  [CS]    Clinical Course User Index [CS] Truddie Hidden, MD     MDM Rules/Calculators/A&P Medical Decision Making Problems Addressed: Injury of head, initial encounter: acute illness or injury Post concussive syndrome: acute illness or injury  Amount and/or Complexity of Data Reviewed Radiology: ordered and  independent interpretation performed. Decision-making details documented in ED Course.  Risk OTC drugs.    Final Clinical Impression(s) / ED Diagnoses Final diagnoses:  Injury of head, initial encounter  Post concussive syndrome    Rx / DC Orders ED Discharge Orders     None        Truddie Hidden, MD 10/04/21 1208

## 2021-10-04 NOTE — ED Notes (Signed)
ED Provider at bedside. 

## 2021-10-04 NOTE — ED Triage Notes (Signed)
Pt arrives ambulatory to ED after being seen at [facility] this morning and being advised to come to ED. Pt was bucked off of horse yesterday after denies LOC but is now having headache and back pain. Sent by UC to have CT head.

## 2021-10-17 DIAGNOSIS — F900 Attention-deficit hyperactivity disorder, predominantly inattentive type: Secondary | ICD-10-CM | POA: Diagnosis not present

## 2021-11-01 DIAGNOSIS — K589 Irritable bowel syndrome without diarrhea: Secondary | ICD-10-CM | POA: Diagnosis not present

## 2021-11-01 DIAGNOSIS — D509 Iron deficiency anemia, unspecified: Secondary | ICD-10-CM | POA: Diagnosis not present

## 2021-11-01 DIAGNOSIS — S060X9D Concussion with loss of consciousness of unspecified duration, subsequent encounter: Secondary | ICD-10-CM | POA: Diagnosis not present

## 2021-11-14 DIAGNOSIS — F419 Anxiety disorder, unspecified: Secondary | ICD-10-CM | POA: Diagnosis not present

## 2021-12-05 DIAGNOSIS — F419 Anxiety disorder, unspecified: Secondary | ICD-10-CM | POA: Diagnosis not present

## 2022-01-05 DIAGNOSIS — F419 Anxiety disorder, unspecified: Secondary | ICD-10-CM | POA: Diagnosis not present

## 2022-01-19 DIAGNOSIS — F419 Anxiety disorder, unspecified: Secondary | ICD-10-CM | POA: Diagnosis not present

## 2022-01-25 DIAGNOSIS — F419 Anxiety disorder, unspecified: Secondary | ICD-10-CM | POA: Diagnosis not present

## 2022-01-25 DIAGNOSIS — F900 Attention-deficit hyperactivity disorder, predominantly inattentive type: Secondary | ICD-10-CM | POA: Diagnosis not present

## 2022-01-31 DIAGNOSIS — F458 Other somatoform disorders: Secondary | ICD-10-CM | POA: Diagnosis not present

## 2022-01-31 DIAGNOSIS — F419 Anxiety disorder, unspecified: Secondary | ICD-10-CM | POA: Diagnosis not present

## 2022-01-31 DIAGNOSIS — F439 Reaction to severe stress, unspecified: Secondary | ICD-10-CM | POA: Diagnosis not present

## 2022-02-14 DIAGNOSIS — F419 Anxiety disorder, unspecified: Secondary | ICD-10-CM | POA: Diagnosis not present

## 2022-02-28 DIAGNOSIS — F439 Reaction to severe stress, unspecified: Secondary | ICD-10-CM | POA: Diagnosis not present

## 2022-02-28 DIAGNOSIS — F419 Anxiety disorder, unspecified: Secondary | ICD-10-CM | POA: Diagnosis not present

## 2022-03-15 DIAGNOSIS — F419 Anxiety disorder, unspecified: Secondary | ICD-10-CM | POA: Diagnosis not present

## 2022-04-03 DIAGNOSIS — F4321 Adjustment disorder with depressed mood: Secondary | ICD-10-CM | POA: Diagnosis not present

## 2022-05-09 ENCOUNTER — Ambulatory Visit (HOSPITAL_BASED_OUTPATIENT_CLINIC_OR_DEPARTMENT_OTHER): Payer: BC Managed Care – PPO | Admitting: Obstetrics & Gynecology

## 2022-05-09 DIAGNOSIS — F4321 Adjustment disorder with depressed mood: Secondary | ICD-10-CM | POA: Diagnosis not present

## 2022-05-10 DIAGNOSIS — L905 Scar conditions and fibrosis of skin: Secondary | ICD-10-CM | POA: Diagnosis not present

## 2022-05-10 DIAGNOSIS — D485 Neoplasm of uncertain behavior of skin: Secondary | ICD-10-CM | POA: Diagnosis not present

## 2022-05-11 DIAGNOSIS — F4321 Adjustment disorder with depressed mood: Secondary | ICD-10-CM | POA: Diagnosis not present

## 2022-05-15 DIAGNOSIS — F900 Attention-deficit hyperactivity disorder, predominantly inattentive type: Secondary | ICD-10-CM | POA: Diagnosis not present

## 2022-05-15 DIAGNOSIS — F419 Anxiety disorder, unspecified: Secondary | ICD-10-CM | POA: Diagnosis not present

## 2022-05-16 DIAGNOSIS — F909 Attention-deficit hyperactivity disorder, unspecified type: Secondary | ICD-10-CM | POA: Diagnosis not present

## 2022-05-16 DIAGNOSIS — F411 Generalized anxiety disorder: Secondary | ICD-10-CM | POA: Diagnosis not present

## 2022-05-18 ENCOUNTER — Ambulatory Visit (INDEPENDENT_AMBULATORY_CARE_PROVIDER_SITE_OTHER): Payer: BC Managed Care – PPO | Admitting: Obstetrics & Gynecology

## 2022-05-18 ENCOUNTER — Encounter (HOSPITAL_BASED_OUTPATIENT_CLINIC_OR_DEPARTMENT_OTHER): Payer: Self-pay | Admitting: Obstetrics & Gynecology

## 2022-05-18 VITALS — BP 107/76 | HR 83 | Ht 63.5 in | Wt 148.8 lb

## 2022-05-18 DIAGNOSIS — R87610 Atypical squamous cells of undetermined significance on cytologic smear of cervix (ASC-US): Secondary | ICD-10-CM

## 2022-05-18 DIAGNOSIS — N96 Recurrent pregnancy loss: Secondary | ICD-10-CM

## 2022-05-18 DIAGNOSIS — N200 Calculus of kidney: Secondary | ICD-10-CM | POA: Insufficient documentation

## 2022-05-18 DIAGNOSIS — D841 Defects in the complement system: Secondary | ICD-10-CM | POA: Diagnosis not present

## 2022-05-18 DIAGNOSIS — Z01419 Encounter for gynecological examination (general) (routine) without abnormal findings: Secondary | ICD-10-CM

## 2022-05-18 DIAGNOSIS — Z3046 Encounter for surveillance of implantable subdermal contraceptive: Secondary | ICD-10-CM | POA: Diagnosis not present

## 2022-05-18 MED ORDER — NORETHINDRONE 0.35 MG PO TABS: 1.0000 | ORAL_TABLET | Freq: Every day | ORAL | 3 refills | Status: DC

## 2022-05-18 NOTE — Progress Notes (Signed)
31 y.o. [id number] Married White or Caucasian female here for annual exam.  Desires to have Nexplanon removed.  Currently cycles are regular and last about 6-7 days.  Would like to start POP for contraception, she thinks, at this time.  Has used in the past.  LMP was 8/6 so advised if starting now, use back up method or start with onset of next menstrual cycle.  She is taking a vitamin.  H/O three prior miscarriages.  Has not had any work up.  Discussed today.  Pt would like to do initial blood work for this.  Vaginal progesterone, possible aspirin or anti-coagulation discussed as well.  Patient's last menstrual period was 04/29/2022.          Sexually active: Yes.    The current method of family planning is Nexplanon but it was placed in 12/2018 and is time for removal..    Exercising: Yes.     Smoker:  no  Health Maintenance: Pap:  05/15/2021 ASCUS with neg HR HPV. Reviewed with pt today History of abnormal Pap:  no MMG:  guidelines reviewed Colonoscopy:  guidelines reviewed   reports that she has never smoked. She has never used smokeless tobacco. She reports current alcohol use. She reports that she does not use drugs.  Past Medical History:  Diagnosis Date   Angio-edema    also with family hx in her mother   Anxiety    Depression     No past surgical history on file.  Current Outpatient Medications  Medication Sig Dispense Refill   amphetamine-dextroamphetamine (ADDERALL XR) 10 MG 24 hr capsule Take 10 mg by mouth. Takes 1-2 per day during the week. Takes only prn on weekends.     C1 esterase inhibitor, Human, (BERINERT) 500 UNITS KIT Inject 20 Units/kg into the vein.     escitalopram (LEXAPRO) 20 MG tablet Take 30 mg by mouth daily.     LORazepam (ATIVAN) 0.5 MG tablet Take 0.5 mg by mouth 2 (two) times daily as needed.     Multiple Vitamin (MULTIVITAMIN) tablet Take 1 tablet by mouth daily.     norethindrone (MICRONOR) 0.35 MG tablet Take 1 tablet (0.35 mg total) by mouth daily.  84 tablet 3   TAKHZYRO 300 MG/2ML SOLN Inject into the skin.     No current facility-administered medications for this visit.    Family History  Problem Relation Age of Onset   Angioedema Mother    Depression Mother    Anxiety disorder Mother    Angioedema Sister    Angioedema Brother    Breast cancer Paternal Grandmother        age 75   Dementia Paternal Grandfather    Angioedema Son     ROS: Constitutional: negative Genitourinary:negative  Exam:   BP 107/76 (BP Location: Left Arm, Patient Position: Sitting, Cuff Size: Normal)   Pulse 83   Ht 5' 3.5" (1.613 m) Comment: Reported  Wt 148 lb 12.8 oz (67.5 kg)   LMP 04/29/2022   BMI 25.95 kg/m   Height: 5' 3.5" (161.3 cm) (Reported)  General appearance: alert, cooperative and appears stated age Head: Normocephalic, without obvious abnormality, atraumatic Neck: no adenopathy, supple, symmetrical, trachea midline and thyroid normal to inspection and palpation Lungs: clear to auscultation bilaterally Breasts: normal appearance, no masses or tenderness Heart: regular rate and rhythm Abdomen: soft, non-tender; bowel sounds normal; no masses,  no organomegaly Extremities: extremities normal, atraumatic, no cyanosis or edema Skin: Skin color, texture, turgor normal. No rashes or  lesions Lymph nodes: Cervical, supraclavicular, and axillary nodes normal. No abnormal inguinal nodes palpated Neurologic: Grossly normal   Pelvic: External genitalia:  no lesions              Urethra:  normal appearing urethra with no masses, tenderness or lesions              Bartholins and Skenes: normal                 Vagina: normal appearing vagina with normal color and no discharge, no lesions              Cervix: no lesions              Pap taken: No. Bimanual Exam:  Uterus:  normal size, contour, position, consistency, mobility, non-tender              Adnexa: normal adnexa and no mass, fullness, tenderness               Anus: no  lesions  After exam, consent reviewed and signed.  Risks reviewed.  n  Procedure: Patient placed supine on exam table with her left arm flexed at the elbow and externally rotated. The prior insertion site was located and the Nexplanon rod was palpated.  Area cleansed with Betadine x 3 and draped in normal sterile fashion.  Insertion site and surrounding tissue anesthetized with 1% Lidocaine without epinephrine, 2cc total used.  Small incision made with #11 blade.  Capsule around rod was dissected with #11 blade, releasing the Nexplanon rod.  This was then removed without difficulty using hemostat.  Small amount of bleeding was noted and made hemostatic with pressure.  Steri-strips were applied and pressure dressing placed over the site.  Entire procedure performed with sterile technique.  Pt tolerated procedure well.  Chaperone, Octaviano Batty, CMA, was present for exam.  Assessment/Plan: 1. Well woman exam with routine gynecological exam - Pap smear ASCUS with neg HR HPV last year.  Repeat in 2025. - Mammogram guidelines reviewed - Colonoscopy guidelines reviewed - vaccines reviewed/updated.  Pt is unsure of last Tdap.  With exposure, update recommended  2. ASCUS of cervix with negative high risk HPV - reviewed with pt today  3. Nexplanon removal - post procedure instructions reviewed with pt.  Questions answered.  Pt knows to call with any concerns or questions.  - rx for micronor to pharmacy  4. History of recurrent miscarriages - Lupus anticoagulant panel( [facility]) - Antiphospholipid Syndrome Comp  5. Hereditary angioedema ([place])

## 2022-05-25 LAB — ANTIPHOSPHOLIPID SYNDROME COMP
APTT: 28.5 s
Anticardiolipin Ab, IgA: <10 [APL'U]
Anticardiolipin Ab, IgG: 10 [GPL'U]
Anticardiolipin Ab, IgM: <10 [MPL'U]
Antiphosphatidylserine IgG: 2 {GPS'U}
Antiphosphatidylserine IgM: 0 {MPS'U}
Antiprothrombin Antibody, IgG: 5 G units
Beta-2 Glycoprotein I, IgA: <10 SAU
Beta-2 Glycoprotein I, IgG: <10 SGU
Beta-2 Glycoprotein I, IgM: <10 SMU
DRVVT Screen Seconds: 35.4 s
Hexagonal Phospholipid Neutral: 5 s
Platelet Neutralization: 0.1 s

## 2022-05-25 LAB — LUPUS ANTICOAGULANT PANEL
Dilute Viper Venom Time: 36.5 s (ref 0.0–47.0)
PTT Lupus Anticoagulant: 35.7 s (ref 0.0–43.5)

## 2022-05-29 DIAGNOSIS — F4321 Adjustment disorder with depressed mood: Secondary | ICD-10-CM | POA: Diagnosis not present

## 2022-06-01 DIAGNOSIS — Z0000 Encounter for general adult medical examination without abnormal findings: Secondary | ICD-10-CM | POA: Diagnosis not present

## 2022-06-01 DIAGNOSIS — Z23 Encounter for immunization: Secondary | ICD-10-CM | POA: Diagnosis not present

## 2022-06-13 ENCOUNTER — Other Ambulatory Visit (HOSPITAL_BASED_OUTPATIENT_CLINIC_OR_DEPARTMENT_OTHER): Payer: Self-pay | Admitting: Obstetrics & Gynecology

## 2022-06-13 ENCOUNTER — Telehealth (HOSPITAL_BASED_OUTPATIENT_CLINIC_OR_DEPARTMENT_OTHER): Payer: Self-pay | Admitting: Obstetrics & Gynecology

## 2022-06-13 NOTE — Telephone Encounter (Signed)
Pt called and left message on voicemail with questions about possible pregnancy and if she needs to be on any medication.  Is taking the mini pill and had unprotected intercourse and some concerns about what to do to decrease risk of miscarriage.  Also stated she had some additional questions about the mini-pill.  I personally called her back and advised to take a UPT 4 weeks after episode of intercourse when she is concerned about pregnancy risk.  Also, no treatment is needed until has positive UPT.  Advised pt to call back or send my chart message if has additional questions.

## 2022-06-20 DIAGNOSIS — F4321 Adjustment disorder with depressed mood: Secondary | ICD-10-CM | POA: Diagnosis not present

## 2022-07-04 DIAGNOSIS — F4321 Adjustment disorder with depressed mood: Secondary | ICD-10-CM | POA: Diagnosis not present

## 2022-07-05 DIAGNOSIS — J069 Acute upper respiratory infection, unspecified: Secondary | ICD-10-CM | POA: Diagnosis not present

## 2022-07-05 DIAGNOSIS — R051 Acute cough: Secondary | ICD-10-CM | POA: Diagnosis not present

## 2022-07-05 DIAGNOSIS — R0981 Nasal congestion: Secondary | ICD-10-CM | POA: Diagnosis not present

## 2022-07-05 DIAGNOSIS — J029 Acute pharyngitis, unspecified: Secondary | ICD-10-CM | POA: Diagnosis not present

## 2022-07-05 DIAGNOSIS — J3489 Other specified disorders of nose and nasal sinuses: Secondary | ICD-10-CM | POA: Diagnosis not present

## 2022-07-11 DIAGNOSIS — J019 Acute sinusitis, unspecified: Secondary | ICD-10-CM | POA: Diagnosis not present

## 2022-07-19 DIAGNOSIS — F4321 Adjustment disorder with depressed mood: Secondary | ICD-10-CM | POA: Diagnosis not present

## 2022-07-26 ENCOUNTER — Other Ambulatory Visit: Payer: Self-pay | Admitting: Family Medicine

## 2022-07-26 ENCOUNTER — Ambulatory Visit
Admission: RE | Admit: 2022-07-26 | Discharge: 2022-07-26 | Disposition: A | Discharge status: Home / Self Care | Payer: BC Managed Care – PPO | Source: Ambulatory Visit | Attending: Family Medicine | Admitting: Family Medicine

## 2022-07-26 DIAGNOSIS — R059 Cough, unspecified: Secondary | ICD-10-CM | POA: Diagnosis not present

## 2022-07-26 DIAGNOSIS — J069 Acute upper respiratory infection, unspecified: Secondary | ICD-10-CM | POA: Diagnosis not present

## 2022-07-30 DIAGNOSIS — L570 Actinic keratosis: Secondary | ICD-10-CM | POA: Diagnosis not present

## 2022-07-30 DIAGNOSIS — D2261 Melanocytic nevi of right upper limb, including shoulder: Secondary | ICD-10-CM | POA: Diagnosis not present

## 2022-07-30 DIAGNOSIS — Z85828 Personal history of other malignant neoplasm of skin: Secondary | ICD-10-CM | POA: Diagnosis not present

## 2022-07-30 DIAGNOSIS — D2371 Other benign neoplasm of skin of right lower limb, including hip: Secondary | ICD-10-CM | POA: Diagnosis not present

## 2022-07-30 DIAGNOSIS — L281 Prurigo nodularis: Secondary | ICD-10-CM | POA: Diagnosis not present

## 2022-08-09 DIAGNOSIS — F4321 Adjustment disorder with depressed mood: Secondary | ICD-10-CM | POA: Diagnosis not present

## 2022-08-24 DIAGNOSIS — F4321 Adjustment disorder with depressed mood: Secondary | ICD-10-CM | POA: Diagnosis not present

## 2022-09-06 DIAGNOSIS — F4321 Adjustment disorder with depressed mood: Secondary | ICD-10-CM | POA: Diagnosis not present

## 2022-11-09 DIAGNOSIS — D841 Defects in the complement system: Secondary | ICD-10-CM | POA: Diagnosis not present

## 2022-11-13 DIAGNOSIS — F900 Attention-deficit hyperactivity disorder, predominantly inattentive type: Secondary | ICD-10-CM | POA: Diagnosis not present

## 2022-11-13 DIAGNOSIS — F419 Anxiety disorder, unspecified: Secondary | ICD-10-CM | POA: Diagnosis not present

## 2022-12-27 ENCOUNTER — Telehealth (HOSPITAL_BASED_OUTPATIENT_CLINIC_OR_DEPARTMENT_OTHER): Payer: Self-pay | Admitting: *Deleted

## 2022-12-27 NOTE — Telephone Encounter (Signed)
Pt called and stated she had a miscarriage last week and has a question related to this.  I left the patient a message on her VM to call us back. Sharrie Rothman CMA

## 2022-12-28 ENCOUNTER — Encounter (HOSPITAL_BASED_OUTPATIENT_CLINIC_OR_DEPARTMENT_OTHER): Payer: Self-pay | Admitting: *Deleted

## 2023-01-28 NOTE — Telephone Encounter (Signed)
Pt never returned the call KW CMA

## 2023-05-03 IMAGING — CT CT HEAD W/O CM
4 series · 16 of 47 positions shown, 18 images · non-contrast
Comparison: None.

CLINICAL DATA: Thrown from a horse.  Headache.



[Series 2: head wo · axial · 0.39mm/px · z∈[-181,-66]mm · 7 of 31 slices shown, 9 images]
[im 4/31  brain]
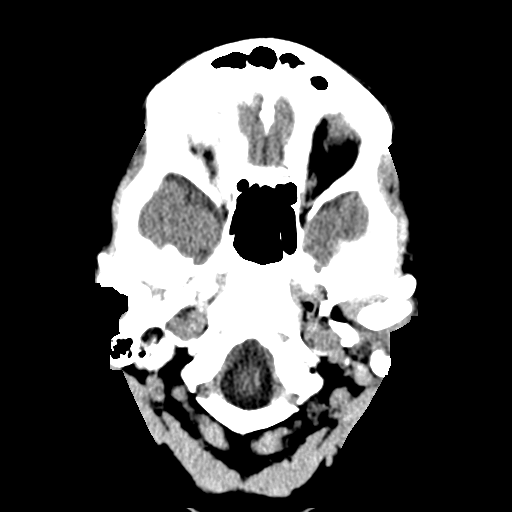
[im 4/31  bone]
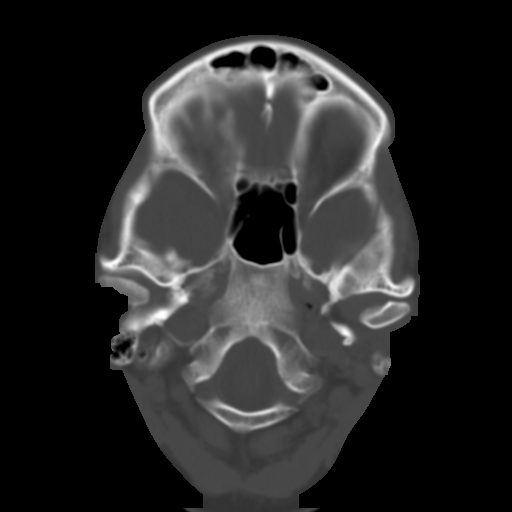
[im 8/31  brain]
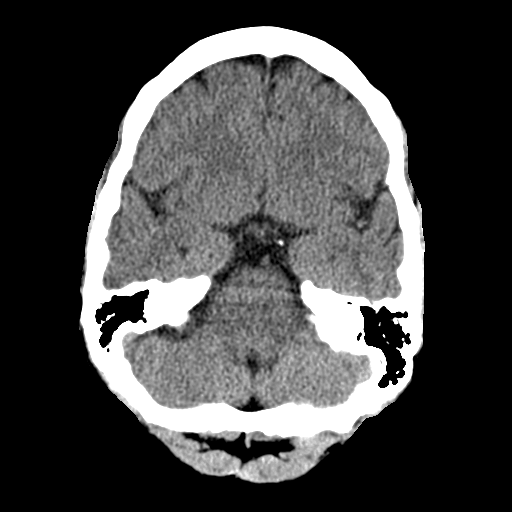
[im 12/31  brain]
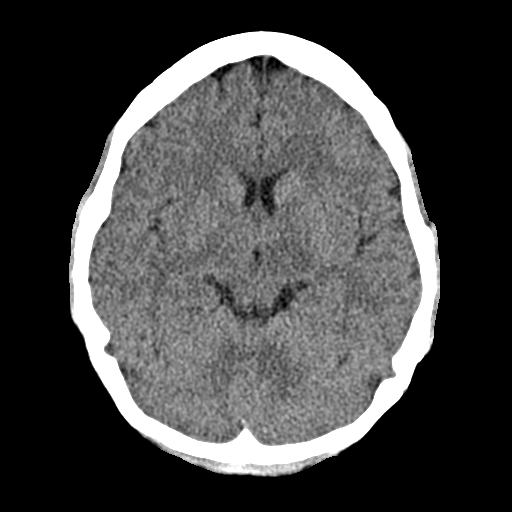
[im 16/31  brain]
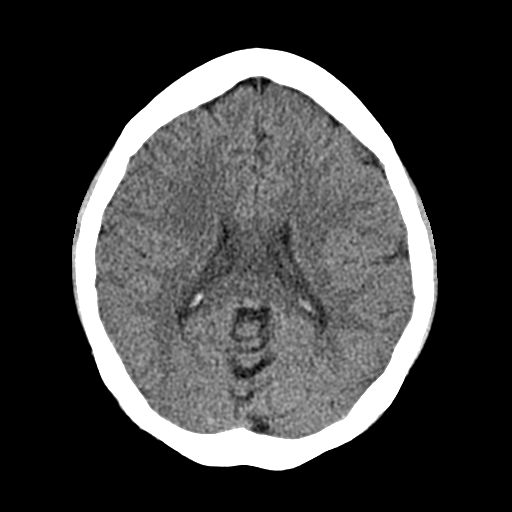
[im 19/31  brain]
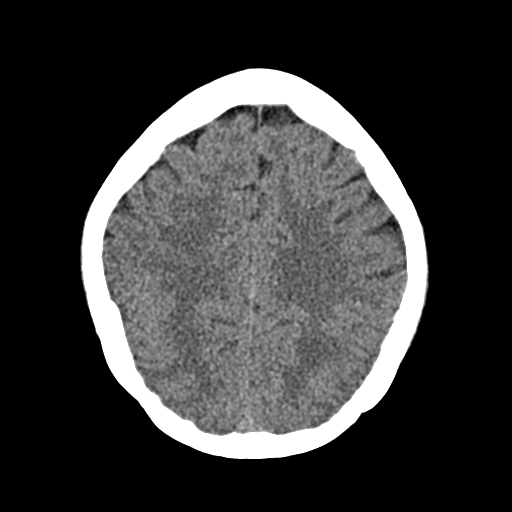
[im 19/31  bone]
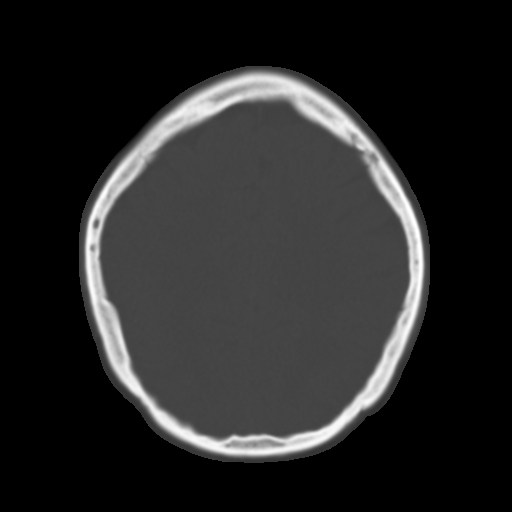
[im 23/31  brain]
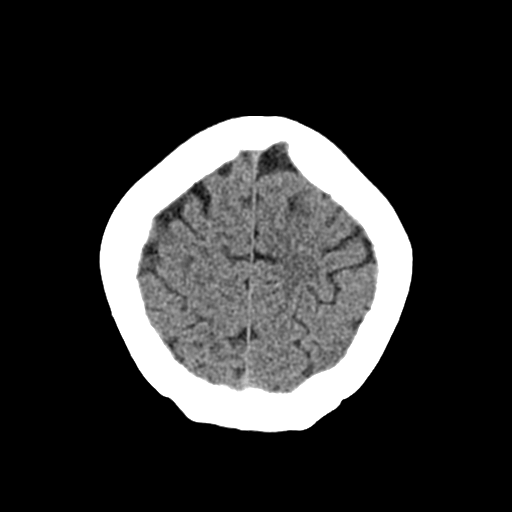
[im 27/31  brain]
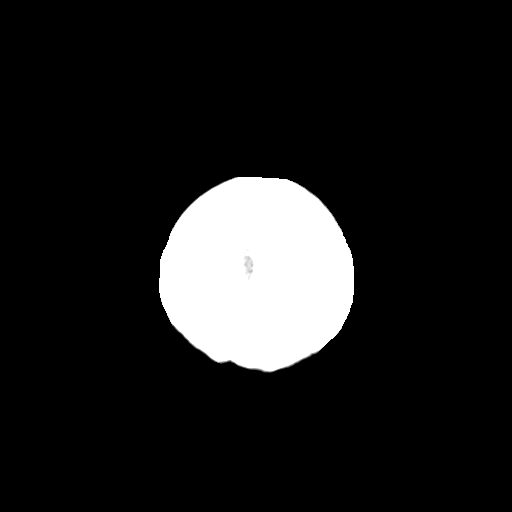

[Series 3: head bone · axial · 0.39mm/px · z∈[-182,-152]mm · 3 of 77 slices shown]
[im 8/77  bone]
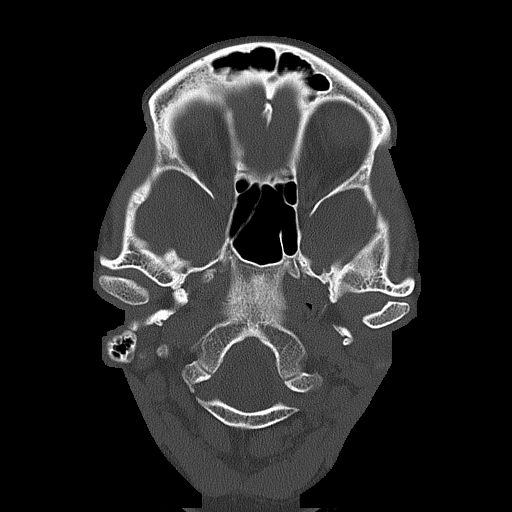
[im 16/77  bone]
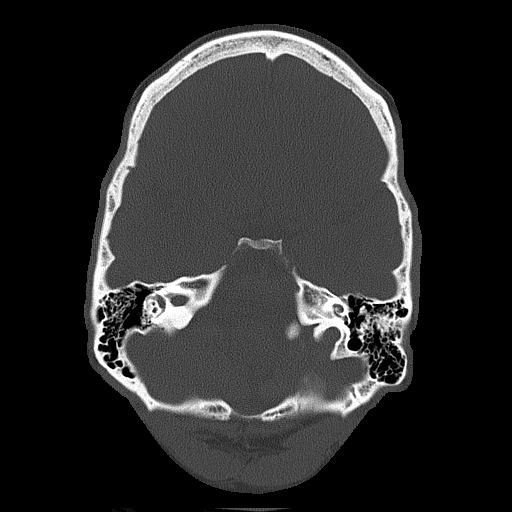
[im 23/77  bone]
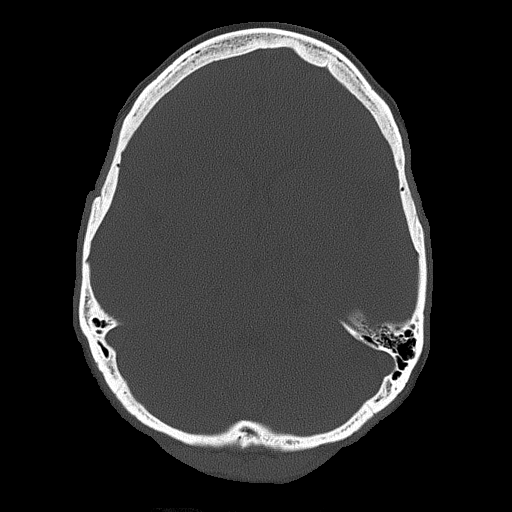

[Series 4: cor soft · coronal · 0.32mm/px · 3 of 66 slices shown]
[im 22/66  brain]
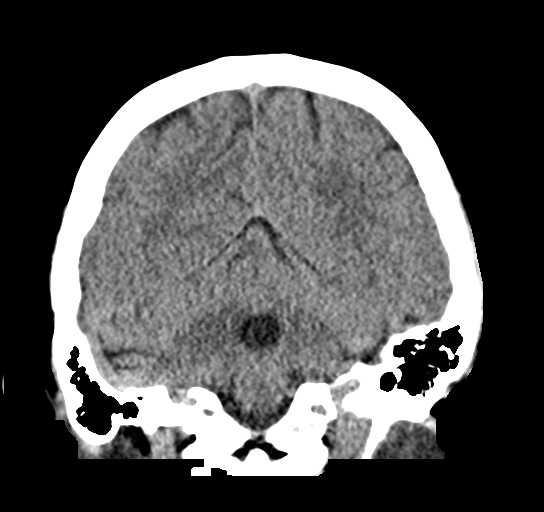
[im 29/66  brain]
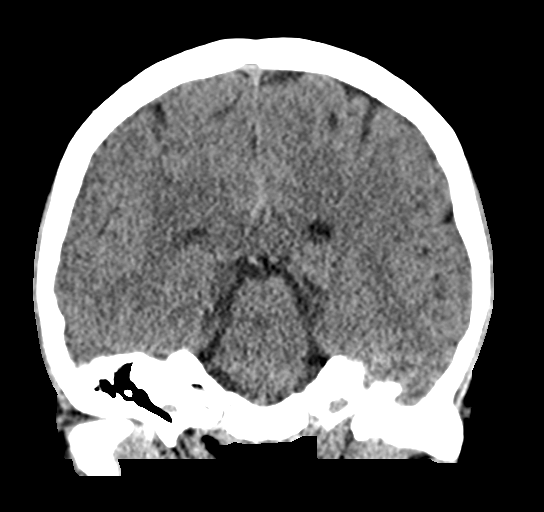
[im 37/66  brain]
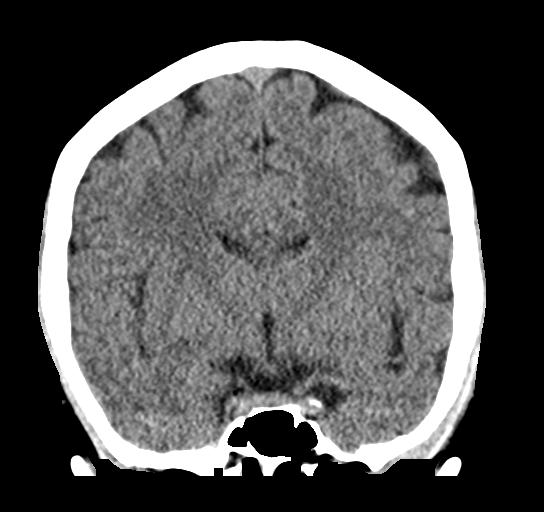

[Series 5: sag soft · sagittal · 0.31mm/px · 3 of 52 slices shown]
[im 18/52  brain]
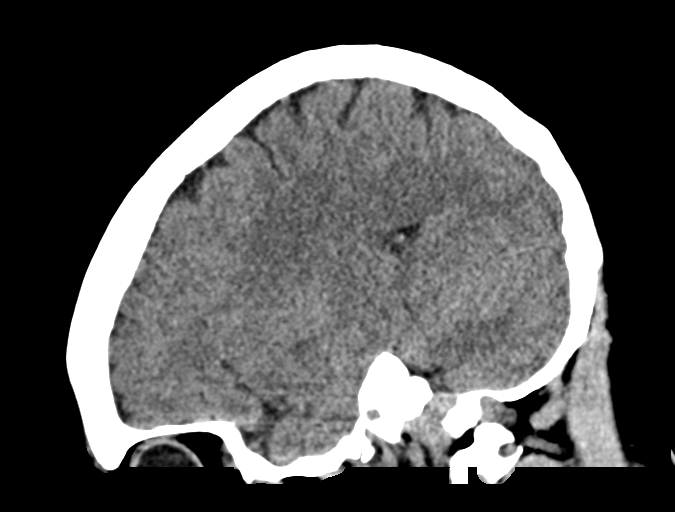
[im 26/52  brain]
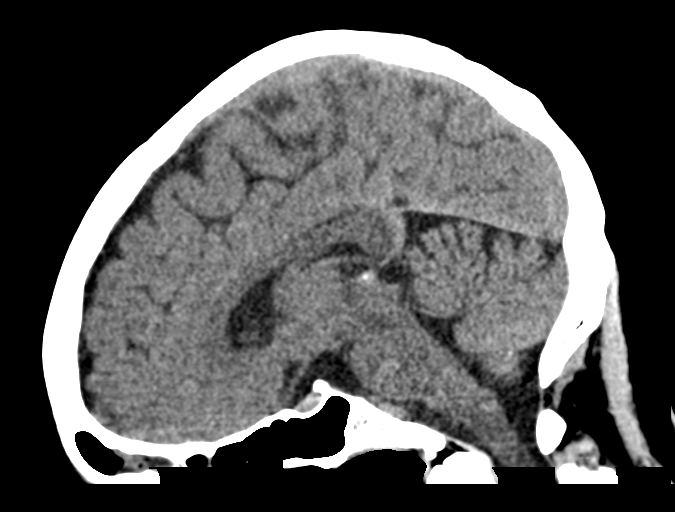
[im 35/52  brain]
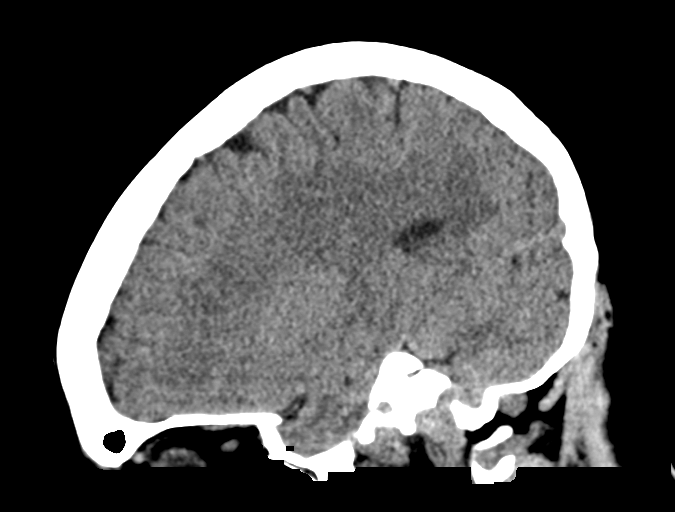

[16 of 47 positions shown; findings below may reference images not displayed]

FINDINGS: Brain: No evidence of acute infarction, hemorrhage, hydrocephalus,
extra-axial collection or mass lesion/mass effect.

Vascular: No hyperdense vessel or unexpected calcification.

Skull: No skull fracture or bone lesion.

Sinuses/Orbits: Right-sided ethmoid and maxillary sinus disease.
Mastoid air cells and middle ear cavities are clear. The globes are
intact.

Other: No scalp lesions or scalp hematoma.
IMPRESSION: 1. No acute intracranial findings or skull fracture.
2. Right-sided ethmoid and maxillary sinus disease.

## 2023-05-14 ENCOUNTER — Emergency Department (HOSPITAL_BASED_OUTPATIENT_CLINIC_OR_DEPARTMENT_OTHER): Payer: BLUE CROSS/BLUE SHIELD

## 2023-05-14 ENCOUNTER — Other Ambulatory Visit: Payer: Self-pay

## 2023-05-14 ENCOUNTER — Encounter (HOSPITAL_BASED_OUTPATIENT_CLINIC_OR_DEPARTMENT_OTHER): Payer: Self-pay

## 2023-05-14 ENCOUNTER — Emergency Department (HOSPITAL_BASED_OUTPATIENT_CLINIC_OR_DEPARTMENT_OTHER)
Admission: EM | Admit: 2023-05-14 | Discharge: 2023-05-15 | Disposition: A | Discharge status: Home / Self Care | Payer: BLUE CROSS/BLUE SHIELD | Attending: Emergency Medicine | Admitting: Emergency Medicine

## 2023-05-14 DIAGNOSIS — R42 Dizziness and giddiness: Secondary | ICD-10-CM | POA: Diagnosis present

## 2023-05-14 DIAGNOSIS — Z20822 Contact with and (suspected) exposure to covid-19: Secondary | ICD-10-CM | POA: Diagnosis not present

## 2023-05-14 DIAGNOSIS — J189 Pneumonia, unspecified organism: Secondary | ICD-10-CM

## 2023-05-14 LAB — CBC WITH DIFFERENTIAL/PLATELET
Abs Immature Granulocytes: 0.02 10*3/uL (ref 0.00–0.07)
Basophils Absolute: 0 10*3/uL (ref 0.0–0.1)
Basophils Relative: 1 %
Eosinophils Absolute: 0.1 10*3/uL (ref 0.0–0.5)
Eosinophils Relative: 1 %
HCT: 37.1 % (ref 36.0–46.0)
Hemoglobin: 11.8 g/dL — ABNORMAL LOW (ref 12.0–15.0)
Immature Granulocytes: 0 %
Lymphocytes Relative: 28 %
Lymphs Abs: 1.6 10*3/uL (ref 0.7–4.0)
MCH: 26.9 pg (ref 26.0–34.0)
MCHC: 31.8 g/dL (ref 30.0–36.0)
MCV: 84.5 fL (ref 80.0–100.0)
Monocytes Absolute: 0.5 10*3/uL (ref 0.1–1.0)
Monocytes Relative: 8 %
Neutro Abs: 3.6 10*3/uL (ref 1.7–7.7)
Neutrophils Relative %: 62 %
Platelets: 245 10*3/uL (ref 150–400)
RBC: 4.39 MIL/uL (ref 3.87–5.11)
RDW: 15.1 % (ref 11.5–15.5)
WBC: 5.8 10*3/uL (ref 4.0–10.5)
nRBC: 0 % (ref 0.0–0.2)

## 2023-05-14 LAB — URINALYSIS, ROUTINE W REFLEX MICROSCOPIC
Bilirubin Urine: NEGATIVE
Glucose, UA: NEGATIVE mg/dL
Hgb urine dipstick: NEGATIVE
Ketones, ur: NEGATIVE mg/dL
Leukocytes,Ua: NEGATIVE
Nitrite: NEGATIVE
Protein, ur: NEGATIVE mg/dL
Specific Gravity, Urine: 1.025 (ref 1.005–1.030)
pH: 7 (ref 5.0–8.0)

## 2023-05-14 LAB — RESP PANEL BY RT-PCR (RSV, FLU A&B, COVID)  RVPGX2
Influenza A by PCR: NEGATIVE
Influenza B by PCR: NEGATIVE
Resp Syncytial Virus by PCR: NEGATIVE
SARS Coronavirus 2 by RT PCR: NEGATIVE

## 2023-05-14 LAB — PREGNANCY, URINE: Preg Test, Ur: NEGATIVE

## 2023-05-14 MED ORDER — SODIUM CHLORIDE 0.9 % IV BOLUS
1000.0000 mL | Freq: Once | INTRAVENOUS | Status: AC
  Administered 2023-05-14: 1000 mL via INTRAVENOUS

## 2023-05-14 MED ORDER — ACETAMINOPHEN 500 MG PO TABS
1000.0000 mg | ORAL_TABLET | Freq: Once | ORAL | Status: AC
  Administered 2023-05-14: 1000 mg via ORAL
  Filled 2023-05-14: qty 2

## 2023-05-14 NOTE — ED Provider Notes (Signed)
[place] [facility]ERGENCY DEPARTMENT AT [facility] Provider Note   CSN: [id number] Arrival date & time: 05/14/23  2038     History  Chief Complaint  Patient presents with   Dizziness    Leysha Drill is a 32 y.o. female.  Patient is a 32 year old female with history of hereditary angioedema.  Patient presenting today for evaluation of dizziness.  She has felt dizzy and lightheaded for the past 2 days.  This seems to be worse when she moves.  She does not describe this as a spinning or an off-balance sensation.  She does report some cough, but no abdominal pain, nausea, vomiting, abdominal pain, sore throat, or earache.  She denies any ill contacts.  Patient denies fever at home, but arrives here with a temp of 101.  The history is provided by the patient.       Home Medications Prior to Admission medications   Medication Sig Start Date End Date Taking? Authorizing Provider  amphetamine-dextroamphetamine (ADDERALL XR) 10 MG 24 hr capsule Take 10 mg by mouth. Takes 1-2 per day during the week. Takes only prn on weekends.    [provider]  C1 esterase inhibitor, Human, (BERINERT) 500 UNITS KIT Inject 20 Units/kg into the vein.    [provider]  escitalopram (LEXAPRO) 20 MG tablet Take 30 mg by mouth daily.    [provider]  LORazepam (ATIVAN) 0.5 MG tablet Take 0.5 mg by mouth 2 (two) times daily as needed. 01/18/21   [provider]  Multiple Vitamin (MULTIVITAMIN) tablet Take 1 tablet by mouth daily.    [provider]  norethindrone (MICRONOR) 0.35 MG tablet Take 1 tablet (0.35 mg total) by mouth daily. 05/18/22   Jerene Bears, MD  TAKHZYRO 300 MG/2ML SOLN Inject into the skin. 03/28/21   [provider]      Allergies    Patient has no known allergies.    Review of Systems   Review of Systems  All other systems reviewed and are negative.   Physical Exam Updated Vital Signs BP (!) 103/38 (BP Location: Right  Arm)   Pulse (!) 106   Temp (!) 101 F (38.3 C)   Resp 20   Ht 5\' 3"  (1.6 m)   Wt 72.6 kg   LMP 05/06/2023 (Exact Date)   SpO2 98%   BMI 28.34 kg/m  Physical Exam Vitals and nursing note reviewed.  Constitutional:      General: She is not in acute distress.    Appearance: She is well-developed. She is not diaphoretic.  HENT:     Head: Normocephalic and atraumatic.     Right Ear: Tympanic membrane normal.     Left Ear: Tympanic membrane normal.     Mouth/Throat:     Mouth: Mucous membranes are moist.     Pharynx: No oropharyngeal exudate or posterior oropharyngeal erythema.  Cardiovascular:     Rate and Rhythm: Normal rate and regular rhythm.     Heart sounds: No murmur heard.    No friction rub. No gallop.  Pulmonary:     Effort: Pulmonary effort is normal. No respiratory distress.     Breath sounds: Normal breath sounds. No wheezing.  Abdominal:     General: Bowel sounds are normal. There is no distension.     Palpations: Abdomen is soft.     Tenderness: There is no abdominal tenderness.  Musculoskeletal:        General: Normal range of motion.  Cervical back: Normal range of motion and neck supple.  Skin:    General: Skin is warm and dry.  Neurological:     General: No focal deficit present.     Mental Status: She is alert and oriented to person, place, and time.     ED Results / Procedures / Treatments   Labs (all labs ordered are listed, but only abnormal results are displayed) Labs Reviewed  RESP PANEL BY RT-PCR (RSV, FLU A&B, COVID)  [id number]  BASIC METABOLIC PANEL  CBC WITH DIFFERENTIAL/PLATELET  URINALYSIS, ROUTINE W REFLEX MICROSCOPIC  PREGNANCY, URINE    EKG None  Radiology No results found.  Procedures Procedures  {Document cardiac monitor, telemetry assessment procedure when appropriate:1}  Medications Ordered in ED Medications  sodium chloride 0.9 % bolus 1,000 mL (has no administration in time range)  acetaminophen (TYLENOL) tablet  1,000 mg (has no administration in time range)    ED Course/ Medical Decision Making/ A&P   {   Click here for ABCD2, HEART and other calculatorsREFRESH Note before signing :1}                              Medical Decision Making Amount and/or Complexity of Data Reviewed Labs: ordered. Radiology: ordered.  Risk OTC drugs.   ***  {Document critical care time when appropriate:1} {Document review of labs and clinical decision tools ie heart score, Chads2Vasc2 etc:1}  {Document your independent review of radiology images, and any outside records:1} {Document your discussion with family members, caretakers, and with consultants:1} {Document social determinants of health affecting pt's care:1} {Document your decision making why or why not admission, treatments were needed:1} Final Clinical Impression(s) / ED Diagnoses Final diagnoses:  None    Rx / DC Orders ED Discharge Orders     None

## 2023-05-14 NOTE — ED Triage Notes (Addendum)
Pt reports dizziness for the past 2 days +nausea Does report a cough and a HA

## 2023-05-15 LAB — BASIC METABOLIC PANEL
Anion gap: 7 (ref 5–15)
BUN: 13 mg/dL (ref 6–20)
CO2: 29 mmol/L (ref 22–32)
Calcium: 8.9 mg/dL (ref 8.9–10.3)
Chloride: 100 mmol/L (ref 98–111)
Creatinine, Ser: 0.76 mg/dL (ref 0.44–1.00)
GFR, Estimated: >60 mL/min (ref >60)
Glucose, Bld: 103 mg/dL — ABNORMAL HIGH (ref 70–99)
Potassium: 4 mmol/L (ref 3.5–5.1)
Sodium: 136 mmol/L (ref 135–145)

## 2023-05-15 MED ORDER — AZITHROMYCIN 250 MG PO TABS
500.0000 mg | ORAL_TABLET | Freq: Once | ORAL | Status: AC
  Administered 2023-05-15: 500 mg via ORAL
  Filled 2023-05-15: qty 2

## 2023-05-15 MED ORDER — AZITHROMYCIN 250 MG PO TABS: 250.0000 mg | ORAL_TABLET | Freq: Every day | ORAL | 0 refills | Status: AC

## 2023-05-15 NOTE — Discharge Instructions (Signed)
Begin taking Zithromax as prescribed.  Take Tylenol 1000 mg rotated with ibuprofen 600 mg every 3-4 hours as needed for fever.  Drink plenty of fluids and get plenty of rest.  Follow-up with your primary doctor in 3 to 4 weeks for a repeat chest x-ray, and return to the ER if you develop severe chest pain, difficulty breathing, or for other new and concerning symptoms.

## 2023-05-22 ENCOUNTER — Other Ambulatory Visit (HOSPITAL_BASED_OUTPATIENT_CLINIC_OR_DEPARTMENT_OTHER): Payer: Self-pay | Admitting: Obstetrics & Gynecology

## 2023-05-31 ENCOUNTER — Ambulatory Visit (HOSPITAL_BASED_OUTPATIENT_CLINIC_OR_DEPARTMENT_OTHER): Payer: BC Managed Care – PPO | Admitting: Obstetrics & Gynecology

## 2023-05-31 ENCOUNTER — Telehealth (HOSPITAL_BASED_OUTPATIENT_CLINIC_OR_DEPARTMENT_OTHER): Payer: Self-pay | Admitting: *Deleted

## 2023-05-31 NOTE — Telephone Encounter (Signed)
Called patient and left a message to call the office back to reschedule her  missed appointment .

## 2023-09-07 ENCOUNTER — Other Ambulatory Visit (HOSPITAL_BASED_OUTPATIENT_CLINIC_OR_DEPARTMENT_OTHER): Payer: Self-pay | Admitting: Obstetrics & Gynecology

## 2023-09-12 NOTE — Telephone Encounter (Signed)
LMOVM for pt to call regarding refill request

## 2023-09-12 NOTE — Telephone Encounter (Signed)
Patient called back and states she does not need this Rx at this time because she just found out that she is pregnant. I did ask patient if she was currently seeing Dr. Allena Katz at [facility] and she said yes. Only because we did not except her current insurance. Her and her husband are in the process of switching insurances so that she can come back at the first of the year. She will give [place] a call back. tbw

## 2023-10-13 ENCOUNTER — Emergency Department (HOSPITAL_BASED_OUTPATIENT_CLINIC_OR_DEPARTMENT_OTHER)
Admission: EM | Admit: 2023-10-13 | Discharge: 2023-10-13 | Disposition: A | Discharge status: Home / Self Care | Payer: BC Managed Care – PPO | Attending: Emergency Medicine | Admitting: Emergency Medicine

## 2023-10-13 ENCOUNTER — Other Ambulatory Visit: Payer: Self-pay

## 2023-10-13 ENCOUNTER — Encounter (HOSPITAL_BASED_OUTPATIENT_CLINIC_OR_DEPARTMENT_OTHER): Payer: Self-pay

## 2023-10-13 DIAGNOSIS — R112 Nausea with vomiting, unspecified: Secondary | ICD-10-CM

## 2023-10-13 DIAGNOSIS — R109 Unspecified abdominal pain: Secondary | ICD-10-CM | POA: Diagnosis not present

## 2023-10-13 DIAGNOSIS — O219 Vomiting of pregnancy, unspecified: Secondary | ICD-10-CM | POA: Insufficient documentation

## 2023-10-13 DIAGNOSIS — R079 Chest pain, unspecified: Secondary | ICD-10-CM | POA: Diagnosis not present

## 2023-10-13 DIAGNOSIS — Z3A09 9 weeks gestation of pregnancy: Secondary | ICD-10-CM | POA: Diagnosis not present

## 2023-10-13 DIAGNOSIS — R000 Tachycardia, unspecified: Secondary | ICD-10-CM | POA: Insufficient documentation

## 2023-10-13 DIAGNOSIS — Z20822 Contact with and (suspected) exposure to covid-19: Secondary | ICD-10-CM | POA: Diagnosis not present

## 2023-10-13 LAB — COMPREHENSIVE METABOLIC PANEL
ALT: 12 U/L (ref 0–44)
AST: 18 U/L (ref 15–41)
Albumin: 3.9 g/dL (ref 3.5–5.0)
Alkaline Phosphatase: 73 U/L (ref 38–126)
Anion gap: 7 (ref 5–15)
BUN: 11 mg/dL (ref 6–20)
CO2: 28 mmol/L (ref 22–32)
Calcium: 9.1 mg/dL (ref 8.9–10.3)
Chloride: 98 mmol/L (ref 98–111)
Creatinine, Ser: 0.61 mg/dL (ref 0.44–1.00)
GFR, Estimated: >60 mL/min (ref >60)
Glucose, Bld: 99 mg/dL (ref 70–99)
Potassium: 3.4 mmol/L — ABNORMAL LOW (ref 3.5–5.1)
Sodium: 133 mmol/L — ABNORMAL LOW (ref 135–145)
Total Bilirubin: 0.5 mg/dL (ref 0.0–1.2)
Total Protein: 7.2 g/dL (ref 6.5–8.1)

## 2023-10-13 LAB — URINALYSIS, ROUTINE W REFLEX MICROSCOPIC
Bilirubin Urine: NEGATIVE
Glucose, UA: NEGATIVE mg/dL
Hgb urine dipstick: NEGATIVE
Ketones, ur: 40 mg/dL — AB
Leukocytes,Ua: NEGATIVE
Nitrite: NEGATIVE
Protein, ur: NEGATIVE mg/dL
Specific Gravity, Urine: 1.02 (ref 1.005–1.030)
pH: 7 (ref 5.0–8.0)

## 2023-10-13 LAB — CBC
HCT: 36.5 % (ref 36.0–46.0)
Hemoglobin: 12.4 g/dL (ref 12.0–15.0)
MCH: 28.1 pg (ref 26.0–34.0)
MCHC: 34 g/dL (ref 30.0–36.0)
MCV: 82.8 fL (ref 80.0–100.0)
Platelets: 255 10*3/uL (ref 150–400)
RBC: 4.41 MIL/uL (ref 3.87–5.11)
RDW: 14.1 % (ref 11.5–15.5)
WBC: 9.7 10*3/uL (ref 4.0–10.5)
nRBC: 0 % (ref 0.0–0.2)

## 2023-10-13 LAB — URINALYSIS, MICROSCOPIC (REFLEX)

## 2023-10-13 LAB — PREGNANCY, URINE: Preg Test, Ur: POSITIVE — AB

## 2023-10-13 LAB — TROPONIN I (HIGH SENSITIVITY): Troponin I (High Sensitivity): 3 ng/L (ref <18)

## 2023-10-13 LAB — RESP PANEL BY RT-PCR (RSV, FLU A&B, COVID)  RVPGX2
Influenza A by PCR: NEGATIVE
Influenza B by PCR: NEGATIVE
Resp Syncytial Virus by PCR: NEGATIVE
SARS Coronavirus 2 by RT PCR: NEGATIVE

## 2023-10-13 LAB — MAGNESIUM: Magnesium: 1.7 mg/dL (ref 1.7–2.4)

## 2023-10-13 LAB — LIPASE, BLOOD: Lipase: 31 U/L (ref 11–51)

## 2023-10-13 MED ORDER — ONDANSETRON 4 MG PO TBDP: 4.0000 mg | ORAL_TABLET | Freq: Three times a day (TID) | ORAL | 0 refills | Status: AC | PRN

## 2023-10-13 MED ORDER — ONDANSETRON 4 MG PO TBDP
4.0000 mg | ORAL_TABLET | Freq: Once | ORAL | Status: AC
  Administered 2023-10-13: 4 mg via ORAL
  Filled 2023-10-13: qty 1

## 2023-10-13 MED ORDER — SODIUM CHLORIDE 0.9 % IV BOLUS
1000.0000 mL | Freq: Once | INTRAVENOUS | Status: AC
  Administered 2023-10-13: 1000 mL via INTRAVENOUS

## 2023-10-13 NOTE — ED Triage Notes (Signed)
States [redacted] weeks pregnant, been having bad cramps all day and vomiting, unable to keep anything down. Also c/o chest pain and lower back cramps. Denies vaginal bleeding.   [id number]

## 2023-10-13 NOTE — Discharge Instructions (Signed)
You are seen in the emergency department for nausea vomiting chest pain abdominal cramps.  Your lab work was fairly unremarkable, your COVID and flu test were negative.  You were given fluids and nausea medication and we are sending you home with a prescription for nausea medication.  Please start with a clear liquid diet advance as tolerated.  Follow-up with your OB team.  Return if any worsening or concerning symptoms

## 2023-10-13 NOTE — ED Provider Notes (Signed)
[facility] [facility] Provider Note   CSN: [id number] Arrival date & time: 10/13/23  1731     History {Add pertinent medical, surgical, social history, OB history to HPI:1} No chief complaint on file.   Ariel Richardson is a 32 y.o. female.  She is approximately [redacted] weeks pregnant.  She has been having some abdominal cramps and vomiting pain in her back some pain in her chest and feeling short of breath.  Feeling generally weak.  She does not think she has had a fever.  She has been trying Gatorade but unable to keep it down.  No urinary symptoms no bleeding no diarrhea.  The history is provided by the patient and the spouse.  Emesis Severity:  Moderate Duration:  1 day Chronicity:  New Associated symptoms: abdominal pain and myalgias   Associated symptoms: no diarrhea and no fever   Risk factors: pregnant        Home Medications Prior to Admission medications   Medication Sig Start Date End Date Taking? Authorizing Provider  amphetamine-dextroamphetamine (ADDERALL XR) 10 MG 24 hr capsule Take 10 mg by mouth. Takes 1-2 per day during the week. Takes only prn on weekends.    [provider]  azithromycin (ZITHROMAX) 250 MG tablet Take 1 tablet (250 mg total) by mouth daily. 05/15/23   Geoffery Lyons, MD  C1 esterase inhibitor, Human, (BERINERT) 500 UNITS KIT Inject 20 Units/kg into the vein.    [provider]  escitalopram (LEXAPRO) 20 MG tablet Take 30 mg by mouth daily.    [provider]  LORazepam (ATIVAN) 0.5 MG tablet Take 0.5 mg by mouth 2 (two) times daily as needed. 01/18/21   [provider]  Multiple Vitamin (MULTIVITAMIN) tablet Take 1 tablet by mouth daily.    [provider]  norethindrone (MICRONOR) 0.35 MG tablet TAKE 1 TABLET BY MOUTH EVERY DAY 05/22/23   Jerene Bears, MD  TAKHZYRO 300 MG/2ML SOLN Inject into the skin. 03/28/21   [provider]      Allergies    Patient has no  known allergies.    Review of Systems   Review of Systems  Constitutional:  Positive for fatigue. Negative for fever.  Respiratory:  Positive for shortness of breath.   Cardiovascular:  Negative for chest pain.  Gastrointestinal:  Positive for abdominal pain, nausea and vomiting. Negative for diarrhea.  Genitourinary:  Negative for dysuria and vaginal bleeding.  Musculoskeletal:  Positive for myalgias.    Physical Exam Updated Vital Signs BP 118/75 (BP Location: Left Arm)   Pulse (!) 104   Temp 98.7 F (37.1 C)   Resp 18   Ht 5\' 3"  (1.6 m)   Wt 68 kg   LMP 05/06/2023 (Exact Date)   SpO2 100%   BMI 26.57 kg/m  Physical Exam Vitals and nursing note reviewed.  Constitutional:      General: She is not in acute distress.    Appearance: Normal appearance. She is well-developed.  HENT:     Head: Normocephalic and atraumatic.  Eyes:     Conjunctiva/sclera: Conjunctivae normal.  Cardiovascular:     Rate and Rhythm: Regular rhythm. Tachycardia present.     Heart sounds: No murmur heard. Pulmonary:     Effort: Pulmonary effort is normal. No respiratory distress.     Breath sounds: Normal breath sounds.  Abdominal:     Palpations: Abdomen is soft.     Tenderness: There is no abdominal tenderness. There  is no guarding or rebound.  Musculoskeletal:        General: No deformity.     Cervical back: Neck supple.  Skin:    General: Skin is warm and dry.     Capillary Refill: Capillary refill takes less than 2 seconds.  Neurological:     General: No focal deficit present.     Mental Status: She is alert.     ED Results / Procedures / Treatments   Labs (all labs ordered are listed, but only abnormal results are displayed) Labs Reviewed  COMPREHENSIVE METABOLIC PANEL - Abnormal; Notable for the following components:      Result Value   Sodium 133 (*)    Potassium 3.4 (*)    All other components within normal limits  RESP PANEL BY RT-PCR (RSV, FLU A&B, COVID)  RVPGX2   LIPASE, BLOOD  CBC  URINALYSIS, ROUTINE W REFLEX MICROSCOPIC  PREGNANCY, URINE  MAGNESIUM  TROPONIN I (HIGH SENSITIVITY)    EKG EKG Interpretation Date/Time:  Sunday October 13 2023 17:49:32 EST Ventricular Rate:  105 PR Interval:  122 QRS Duration:  76 QT Interval:  320 QTC Calculation: 422 R Axis:   76  Text Interpretation: Sinus tachycardia T wave abnormality, consider inferior ischemia T wave abnormality, consider anterolateral ischemia Abnormal ECG No previous ECGs available Confirmed by Meridee Score [phone]) on 10/13/2023 6:09:09 PM  Radiology No results found.  Procedures Procedures  {Document cardiac monitor, telemetry assessment procedure when appropriate:1}  Medications Ordered in ED Medications  sodium chloride 0.9 % bolus 1,000 mL (has no administration in time range)  ondansetron (ZOFRAN-ODT) disintegrating tablet 4 mg (4 mg Oral Given 10/13/23 1751)    ED Course/ Medical Decision Making/ A&P   {   Click here for ABCD2, HEART and other calculatorsREFRESH Note before signing :1}                              Medical Decision Making Amount and/or Complexity of Data Reviewed Labs: ordered.  Risk Prescription drug management.   This patient complains of ***; this involves an extensive number of treatment Options and is a complaint that carries with it a high risk of complications and morbidity. The differential includes ***  I ordered, reviewed and interpreted labs, which included *** I ordered medication *** and reviewed PMP when indicated. I ordered imaging studies which included *** and I independently    visualized and interpreted imaging which showed *** Additional history obtained from *** Previous records obtained and reviewed *** I consulted *** and discussed lab and imaging findings and discussed disposition.  Cardiac monitoring reviewed, *** Social determinants considered, *** Critical Interventions: ***  After the interventions stated  above, I reevaluated the patient and found *** Admission and further testing considered, ***   {Document critical care time when appropriate:1} {Document review of labs and clinical decision tools ie heart score, Chads2Vasc2 etc:1}  {Document your independent review of radiology images, and any outside records:1} {Document your discussion with family members, caretakers, and with consultants:1} {Document social determinants of health affecting pt's care:1} {Document your decision making why or why not admission, treatments were needed:1} Final Clinical Impression(s) / ED Diagnoses Final diagnoses:  None    Rx / DC Orders ED Discharge Orders     None
